# Patient Record
Sex: Female | Born: 1935 | Race: White | Hispanic: No | Smoking: Current every day smoker
Health system: Southern US, Community
[De-identification: ages and names within clinical notes are randomized; demographics above are authoritative.]

## PROBLEM LIST (undated history)

## (undated) DIAGNOSIS — Z972 Presence of dental prosthetic device (complete) (partial): Secondary | ICD-10-CM

## (undated) DIAGNOSIS — N823 Fistula of vagina to large intestine: Secondary | ICD-10-CM

## (undated) DIAGNOSIS — M1990 Unspecified osteoarthritis, unspecified site: Secondary | ICD-10-CM

## (undated) DIAGNOSIS — N281 Cyst of kidney, acquired: Secondary | ICD-10-CM

## (undated) DIAGNOSIS — E785 Hyperlipidemia, unspecified: Secondary | ICD-10-CM

## (undated) DIAGNOSIS — K08109 Complete loss of teeth, unspecified cause, unspecified class: Secondary | ICD-10-CM

## (undated) DIAGNOSIS — M810 Age-related osteoporosis without current pathological fracture: Secondary | ICD-10-CM

## (undated) DIAGNOSIS — I10 Essential (primary) hypertension: Secondary | ICD-10-CM

## (undated) DIAGNOSIS — I4891 Unspecified atrial fibrillation: Secondary | ICD-10-CM

## (undated) HISTORY — DX: Unspecified osteoarthritis, unspecified site: M19.90

## (undated) HISTORY — DX: Hyperlipidemia, unspecified: E78.5

## (undated) HISTORY — DX: Age-related osteoporosis without current pathological fracture: M81.0

## (undated) HISTORY — DX: Essential (primary) hypertension: I10

## (undated) HISTORY — PX: OTHER SURGICAL HISTORY: SHX169

---

## 1997-05-14 ENCOUNTER — Other Ambulatory Visit: Admission: RE | Admit: 1997-05-14 | Discharge: 1997-05-14 | Payer: Self-pay | Admitting: Obstetrics and Gynecology

## 1998-07-15 ENCOUNTER — Other Ambulatory Visit: Admission: RE | Admit: 1998-07-15 | Discharge: 1998-07-15 | Payer: Self-pay | Admitting: Obstetrics and Gynecology

## 1999-09-04 ENCOUNTER — Other Ambulatory Visit: Admission: RE | Admit: 1999-09-04 | Discharge: 1999-09-04 | Payer: Self-pay | Admitting: Obstetrics and Gynecology

## 2000-09-14 ENCOUNTER — Other Ambulatory Visit: Admission: RE | Admit: 2000-09-14 | Discharge: 2000-09-14 | Payer: Self-pay | Admitting: Obstetrics and Gynecology

## 2003-11-06 ENCOUNTER — Other Ambulatory Visit: Admission: RE | Admit: 2003-11-06 | Discharge: 2003-11-06 | Payer: Self-pay | Admitting: Obstetrics and Gynecology

## 2005-01-04 HISTORY — PX: CHOLECYSTECTOMY: SHX55

## 2008-01-05 HISTORY — PX: COLONOSCOPY: SHX174

## 2008-08-30 ENCOUNTER — Inpatient Hospital Stay (HOSPITAL_COMMUNITY): Admission: EM | Admit: 2008-08-30 | Discharge: 2008-09-02 | Payer: Self-pay | Admitting: Internal Medicine

## 2008-08-30 ENCOUNTER — Encounter (INDEPENDENT_AMBULATORY_CARE_PROVIDER_SITE_OTHER): Payer: Self-pay | Admitting: Internal Medicine

## 2008-08-30 ENCOUNTER — Ambulatory Visit: Payer: Self-pay | Admitting: Vascular Surgery

## 2010-02-24 ENCOUNTER — Inpatient Hospital Stay (HOSPITAL_COMMUNITY)
Admission: EM | Admit: 2010-02-24 | Discharge: 2010-02-26 | DRG: 194 | Disposition: A | Discharge status: Home / Self Care | Payer: Medicare Other | Attending: Internal Medicine | Admitting: Internal Medicine

## 2010-02-24 DIAGNOSIS — N390 Urinary tract infection, site not specified: Secondary | ICD-10-CM | POA: Diagnosis present

## 2010-02-24 DIAGNOSIS — E785 Hyperlipidemia, unspecified: Secondary | ICD-10-CM | POA: Diagnosis present

## 2010-02-24 DIAGNOSIS — K5900 Constipation, unspecified: Secondary | ICD-10-CM | POA: Diagnosis present

## 2010-02-24 DIAGNOSIS — I10 Essential (primary) hypertension: Secondary | ICD-10-CM | POA: Diagnosis present

## 2010-02-24 DIAGNOSIS — Z7982 Long term (current) use of aspirin: Secondary | ICD-10-CM

## 2010-02-24 DIAGNOSIS — J189 Pneumonia, unspecified organism: Principal | ICD-10-CM | POA: Diagnosis present

## 2010-02-25 ENCOUNTER — Emergency Department (HOSPITAL_COMMUNITY): Payer: Medicare Other

## 2010-02-25 LAB — BASIC METABOLIC PANEL
CO2: 24 mEq/L (ref 19–32)
CO2: 27 mEq/L (ref 19–32)
Calcium: 8.3 mg/dL — ABNORMAL LOW (ref 8.4–10.5)
Chloride: 99 mEq/L (ref 96–112)
Creatinine, Ser: 0.72 mg/dL (ref 0.4–1.2)
GFR calc Af Amer: >60 mL/min (ref >60)
Glucose, Bld: 119 mg/dL — ABNORMAL HIGH (ref 70–99)
Sodium: 131 mEq/L — ABNORMAL LOW (ref 135–145)
Sodium: 135 mEq/L (ref 135–145)

## 2010-02-25 LAB — DIFFERENTIAL
Basophils Absolute: 0 10*3/uL (ref 0.0–0.1)
Eosinophils Absolute: 0 10*3/uL (ref 0.0–0.7)
Eosinophils Relative: 0 % (ref 0–5)
Lymphocytes Relative: 11 % — ABNORMAL LOW (ref 12–46)
Lymphocytes Relative: 12 % (ref 12–46)
Lymphs Abs: 1.3 10*3/uL (ref 0.7–4.0)
Monocytes Absolute: 1.5 10*3/uL — ABNORMAL HIGH (ref 0.1–1.0)
Monocytes Absolute: 1.7 10*3/uL — ABNORMAL HIGH (ref 0.1–1.0)
Monocytes Relative: 16 % — ABNORMAL HIGH (ref 3–12)
Neutro Abs: 8 10*3/uL — ABNORMAL HIGH (ref 1.7–7.7)

## 2010-02-25 LAB — CBC
HCT: 36.4 % (ref 36.0–46.0)
HCT: 37.1 % (ref 36.0–46.0)
Hemoglobin: 12.2 g/dL (ref 12.0–15.0)
MCH: 30.6 pg (ref 26.0–34.0)
MCHC: 33.2 g/dL (ref 30.0–36.0)
MCHC: 33.5 g/dL (ref 30.0–36.0)
MCV: 91.2 fL (ref 78.0–100.0)
Platelets: 259 10*3/uL (ref 150–400)
RDW: 13.5 % (ref 11.5–15.5)

## 2010-02-25 LAB — URINALYSIS, ROUTINE W REFLEX MICROSCOPIC
Bilirubin Urine: NEGATIVE
Nitrite: NEGATIVE
Protein, ur: NEGATIVE mg/dL
Urobilinogen, UA: 1 mg/dL (ref 0.0–1.0)

## 2010-02-27 LAB — URINE CULTURE: Culture  Setup Time: 201202231036

## 2010-03-09 NOTE — H&P (Signed)
[name], [name]               ACCOUNT NO.:  [id number]  MEDICAL RECORD NO.:  [id number]           PATIENT TYPE:  LOCATION:                                 FACILITY:  PHYSICIAN:  Della Goo, M.D. DATE OF BIRTH:  28-Sep-1932  DATE OF ADMISSION: DATE OF DISCHARGE:                             HISTORY & PHYSICAL   DATE OF ADMISSION:  February 25, 2010.  PRIMARY CARE PHYSICIAN:  Unassigned.  CHIEF COMPLAINT:  Constipation and elevated blood pressure.  HISTORY OF PRESENT ILLNESS:  This is a 77 year old female who was brought to the emergency department by her daughter secondary to complaints of severe constipation for the past 12 days.  The patient had been seen in the emergency department at [facility] for her symptoms recently and had been found to have no blockage per her report.  The patient continued to take over-the-counter laxative therapies, but continues to have difficulty.  The patient reports having nausea and poor appetite and also having fevers and chills over the past day.  She denies having any vomiting.  The patient was found in the emergency department to have a temperature of 103.0.  She was also found to have an elevated white count of 12.7.  An acute abdominal series was performed, revealing no free air and a nonobstructive bowel gas pattern, and moderate colonic fecal matter was present.  Also, a small left pleural effusion was seen, and a right lower lobe atelectasis/infiltrate was also seen in the chest x-ray portion.  The patient was referred for medical admission secondary to her fever and diagnosis of pneumonia.  PAST MEDICAL HISTORY:  Significant for hypertension and hyperlipidemia.  PAST SURGICAL HISTORY:  History of cholecystectomy and a benign left breast cyst excision.  MEDICATIONS: 1. Simvastatin. 2. Metoprolol. 3. Oxcarbazepine. 4. Aspirin. 5. Cozaar. 6. Amlodipine. 7. Clonidine.  ALLERGIES:  No known drug  allergies.  SOCIAL HISTORY:  The patient lives with her daughter.  She is a nonsmoker, nondrinker.  No history of illicit drug usage.  FAMILY HISTORY:  Noncontributory.  REVIEW OF SYSTEMS:  Pertinents mentioned above.  PHYSICAL EXAMINATION FINDINGS:  GENERAL:  This is a 77 year old elderly, well-nourished, well-developed Caucasian female in no acute distress. VITAL SIGNS:  Temperature 101.3, blood pressure 167/73, heart rate 87, respirations 17, and O2 sat is 98%. HEENT:  Normocephalic, atraumatic.  Pupils equally round and reactive to light.  Extraocular movements are intact.  Funduscopic benign.  There is no scleral icterus.  Nares are patent bilaterally.  Oropharynx is clear. NECK:  Supple, full range of motion.  No thyromegaly, adenopathy, or jugular venous distention. CARDIOVASCULAR:  Regular rate and rhythm.  No murmurs, gallops, or rubs appreciated. LUNGS:  Clear to auscultation bilaterally.  No rales, rhonchi, or wheezes.  There is no laboring of her breathing.  Breath sounds are symmetric. ABDOMEN:  Positive bowel sounds; soft, nontender, nondistended.  No hepatosplenomegaly. EXTREMITIES:  Without cyanosis, clubbing, or edema. NEUROLOGIC:  Nonfocal.  LABORATORY STUDIES:  White blood cell count 12.7, hemoglobin 12.3, hematocrit 37.1, MCV 90.0, platelets 259, neutrophils 76%, and lymphocytes 11%.  Sodium 131, potassium 4.8, chloride 99,  carbon dioxide 24, BUN 8, creatinine 0.70, and glucose 123.  Urinalysis reveals small urine hemoglobin and small leukocyte esterase.  Urine microscopic with rare urine epithelials.  Urine red blood cells 3-6, urine white blood cells 3-6, and few bacteria.  Sodium 131, potassium 4.8, chloride 99, carbon dioxide 24, BUN 8, creatinine 0.70, glucose 123, and calcium 8.8. The acute abdominal series is as mentioned above in the HPI.  ASSESSMENT:  A 77 year old female being admitted with 1. Pneumonia which is community-acquired pneumonia. 2.  Constipation. 3. Hypertension. 4. Hyperlipidemia.  PLAN:  The patient will be admitted.  She will be placed on antibiotic therapy to cover community-acquired pneumonia.  Blood cultures x2 have been requested.  The antibiotic therapy is IV Rocephin and azithromycin at this time.  IV fluids have been ordered for gentle rehydration. Antiemetics have also been ordered as needed.  Laxative therapy has also been ordered.  Please note, in the emergency department, patient had been given magnesium citrate.  The patient's regular medications will be further reconciled, and the patient is a full code.     Della Goo, M.D.     HJ/MEDQ  D:  02/26/2010  T:  02/26/2010  Job:  [id number]  Electronically Signed by Della Goo M.D. on 03/09/2010 08:22:11 PM

## 2010-03-12 NOTE — Discharge Summary (Signed)
[name], LANPHIER               ACCOUNT NO.:  [id number]  MEDICAL RECORD NO.:  [id number]           PATIENT TYPE:  I  LOCATION:  3739                         FACILITY:  [facility]  PHYSICIAN:  Baltazar Najjar, MD     DATE OF BIRTH:  [birth year]  DATE OF ADMISSION:  02/24/2010 DATE OF DISCHARGE:  02/26/2010                              DISCHARGE SUMMARY   FINAL DISCHARGE DIAGNOSES: 1. Community-acquired pneumonia. 2. Constipation. 3. Probable urinary tract infection.  SECONDARY DISCHARGE DIAGNOSES: 1. Hypertension. 2. Hyperlipidemia.  BRIEF ADMITTING HISTORY:  Please refer to the H and P for more details. [name] is 77 year old Caucasian woman with the above medical history presented to the ER with chief complaint of constipation for more than 12 days.  She was also experiencing some fevers and chills.  Denies any vomiting or nausea.  HOSPITAL COURSE:  Workup in the ER revealed elevated white count of 12.7, temperature of 103, and her chest x-ray showed right lower lobe atelectasis versus infiltrates.  The patient was admitted with a diagnosis of community-acquired pneumonia and constipation. 1. Community-acquired pneumonia.  The patient was started on Rocephin     and Zithromax and she remained stable, afebrile and symptomatically     improved as far as her cough and chills. 2. Constipation.  The patient had an acute abdominal x-ray in the ER     that showed moderate proximal chronic fecal material with     nonobstructive bowel gas pattern.  The patient was treated     symptomatically with Senokot-S, MiraLax, and Fleet Enema.  She had     several bowel movements with relief of her constipation and     abdominal discomfort. 3. Probable urinary tract infection.  Her urinalysis on admission     showed small leukocytes and 3-6 wbc's; however, the patient is     asymptomatic.  Denies any dysuria, flank pain of increasing urinary     frequency.  I will send urine for culture and  the patient will be     discharged on Levaquin hopefully that will cover for pneumonia and     UTI and I will defer follow-up of urine culture to her PCP. 4. Hypertension.  The patient was continued on her home     antihypertensive regimen and her blood pressure remained fairly     controlled. 5. Hyperlipidemia.  The patient was continued on her Zocor during this     hospitalization.  DISCHARGE MEDICATIONS: 1. The patient will be discharged on Levaquin 750 mg p.o. daily for 6     days to complete 7 days of antibiotics. 2. MiraLax 17 grams p.o. daily as needed. 3. Senokot-S 8.6/50 mg 2 tablets p.o. daily as needed. 4. Amlodipine 5 mg p.o. daily. 5. Aspirin 81 mg p.o. daily. 6. Clonidine 0.2 mg p.o. daily. 7. Cozaar 100 mg p.o. daily. 8. Metoprolol 25 mg p.o. b.i.d. 9. Oxcarbazepine 300 mg 1 tablet p.o. daily. 10.Zocor 20 mg p.o. daily at bedtime.  DISCHARGE INSTRUCTIONS: 1. The patient to continue the above medications as prescribed. 2. The patient to follow with her PCP within 1 week.  3. Urine culture will be pending at the time of dictation.  I will     defer her follow up to her PCP and adjust antibiotic accordingly if     needed. 4. The patient encouraged to drink plenty of fluids stay on high fiber     health heart diet and take her bowel regimen as needed and also I     encouraged to ambulate, hopefully that will help with her     regulating her bowels. 5. The patient to report any worsening of symptoms or any new symptoms     to her PCP or come to the ED.  CONDITION ON DISCHARGE:  Improved.          ______________________________ Baltazar Najjar, MD     SA/MEDQ  D:  02/26/2010  T:  02/26/2010  Job:  [id number]  cc:   Feliciana Rossetti, MD  Electronically Signed by Hannah Beat MD on 03/11/2010 09:25:50 PM

## 2010-04-11 LAB — LIPID PANEL
HDL: 91 mg/dL (ref >39)
LDL Cholesterol: 51 mg/dL (ref 0–99)
Total CHOL/HDL Ratio: 1.6 RATIO
Triglycerides: 32 mg/dL (ref <150)
VLDL: 6 mg/dL (ref 0–40)

## 2010-04-11 LAB — BASIC METABOLIC PANEL
BUN: 4 mg/dL — ABNORMAL LOW (ref 6–23)
CO2: 27 mEq/L (ref 19–32)
CO2: 28 mEq/L (ref 19–32)
Calcium: 8.3 mg/dL — ABNORMAL LOW (ref 8.4–10.5)
Calcium: 8.7 mg/dL (ref 8.4–10.5)
Creatinine, Ser: 0.56 mg/dL (ref 0.4–1.2)
Creatinine, Ser: 0.64 mg/dL (ref 0.4–1.2)
GFR calc Af Amer: >60 mL/min (ref >60)
Glucose, Bld: 89 mg/dL (ref 70–99)

## 2010-04-11 LAB — CBC
HCT: 39.3 % (ref 36.0–46.0)
MCHC: 33.5 g/dL (ref 30.0–36.0)
Platelets: 269 10*3/uL (ref 150–400)
RDW: 13.9 % (ref 11.5–15.5)
WBC: 14.1 10*3/uL — ABNORMAL HIGH (ref 4.0–10.5)

## 2010-04-11 LAB — MAGNESIUM: Magnesium: 2.5 mg/dL (ref 1.5–2.5)

## 2010-04-11 LAB — URINALYSIS, ROUTINE W REFLEX MICROSCOPIC
Hgb urine dipstick: NEGATIVE
Nitrite: NEGATIVE
Protein, ur: NEGATIVE mg/dL
Specific Gravity, Urine: 1.008 (ref 1.005–1.030)
Urobilinogen, UA: 0.2 mg/dL (ref 0.0–1.0)

## 2010-04-11 LAB — COMPREHENSIVE METABOLIC PANEL
AST: 27 U/L (ref 0–37)
Albumin: 3.6 g/dL (ref 3.5–5.2)
Alkaline Phosphatase: 78 U/L (ref 39–117)
BUN: 9 mg/dL (ref 6–23)
Chloride: 99 mEq/L (ref 96–112)
Potassium: 2.9 mEq/L — ABNORMAL LOW (ref 3.5–5.1)
Total Bilirubin: 0.6 mg/dL (ref 0.3–1.2)

## 2010-04-11 LAB — PROTIME-INR: INR: 1 (ref 0.00–1.49)

## 2010-05-19 NOTE — H&P (Signed)
NAME:  [name], [name]               ACCOUNT NO.:  [id number]   MEDICAL RECORD NO.:  [id number]          PATIENT TYPE:  INP   LOCATION:  5531                         FACILITY:  [facility]   PHYSICIAN:  Jude Ojie, MD          DATE OF BIRTH:  1932-11-02   DATE OF ADMISSION:  08/30/2008  DATE OF DISCHARGE:                              HISTORY & PHYSICAL   PRIMARY CARE PHYSICIAN:  The patient is unassigned.   CHIEF COMPLAINT:  Abdominal pain, constipation, nausea and vomiting x5  days.  The patient was transferred from [facility] on account of  ileus for further evaluation.   HISTORY OF PRESENTING COMPLAINT:  The patient is a 77 year old with  history of hypertension, hyperlipidemia who was transferred from  [facility] today to [facility] on account of the ileus.  Of note,  the patient had been started on Caltrate about 1 week ago for treatment  of osteoporosis and had not been drinking enough fluid with the  Caltrate.  Subsequently developed constipation, abdominal distention,  nausea and vomiting for the last 5 days as such presented to the  hospital at [facility].  Was evaluated over there with initial workup done  unremarkable.  Of note, the patient had a CT of the abdomen done at  [facility] which was negative for any obstruction.  The patient was  subsequently transferred here for further evaluation and management.  The patient was given enemas and GoLYTELY over there with no significant  bowel movement but had worsening of the abdominal distention with  discomfort.  At the time I saw the patient, the patient was still having  some distention with mild abdominal pains.  Denies any associated chest  pain or shortness of breath.   Past medical history is significant for:  1. Hypertension.  2. Hyperlipidemia.   ALLERGIES:  The patient has no known drug allergies.   OUTPATIENT MEDICATIONS:  1. Ambien 10 mg p.o. nightly as needed.  2. Aspirin 81 mg p.o. daily.  3. Caltrate  600 plus D 1 tablet p.o. daily.  4. Simvastatin 20 mg p.o. nightly.  5. Amlodipine 5 mg p.o. daily.   FAMILY HISTORY:  The patient lives alone but has good family support  with 2 adult daughters who live close by and they check on her  routinely.   FAMILY HISTORY:  Significant for diabetes in the past.  The patient  smokes half a pack of cigarettes per day, started smoking about 28 years  ago when she lost her husband.  Denies any history of alcohol or IV drug  use.   REVIEW OF SYSTEMS:  GENERAL:  No fevers, weight loss, or loss of  appetite.  RESPIRATORY:  No shortness of breath or cough productive of  sputum.  CVS:  No chest pain or palpitation.  GI:  Positive for nausea,  vomiting, abdominal distention, and constipation.  ENDOCRINE:  No  polyuria, polydipsia, polyphagia.  GENITOURINARY:  No dysuria,  frequency, or urgency.  HEME:  No easy skin bruising or epistaxis.  PSYCH:  No depression.  MUSCULOSKELETAL:  No  joint pains or muscle ache.  Does have chronic low back pain.  NEURO:  The patient denies any  headache, seizures, or syncope.   PHYSICAL EXAMINATION:  VITAL SIGNS:  On presentation, blood pressure of  150/72, pulse rate of 87, respiratory rate of 20, O2 sat of 94%.  The  patient is afebrile.  GENERAL:  Elderly Caucasian lady in mild distress from abdominal  discomfort.  HEENT:  Pupils equal, round, reacting to light and accommodation  bilaterally.  Extraocular muscles intact.  NECK:  Supple.  No JVD.  No thyroid enlargement.  RESPIRATORY:  Chest clear to auscultation bilaterally.  CVS:  Heart sounds S1 and S2.  Regular rate and rhythm.  No murmurs,  rub, or gallop.  ABDOMEN:  Full, mildly distended with mild gaseous tenderness but no  guarding or rebound.  Bowel sounds are mildly positive (hypoactive) in  all quadrants.  RECTAL:  Also the patient had a rectal exam already done prior to  transfer from [place], which was guaiac negative.  EXTREMITIES:  No cyanosis,  clubbing, or edema.  Pedal pulses palpable  bilaterally.  NEURO:  The patient is awake, alert and oriented to time, place, and  person.  No focal findings at this time.    Labs done from [facility] were essentially unremarkable per the  transferring physician.  CT scan of the abdomen and pelvis was done.  The imaging is on a CD in the chart.  Report of the CT abdomen stated  there was moderate colonic distention with fluid, the probable post  enema and colonic ileus.  No bowel or colonic obstruction noted.  No  hydronephrosis or hydroureter.  No ascites of free air.  Pelvis showed  moderate distention of the cecum with fluid without pericecal  inflammation.  Stool noted also within the sigmoid colon.  Plan is to  get a basic labs with CBC, BMP, LFTs at this time, as I cannot find the  official report from labs done at [place] at this time.   ASSESSMENT:  1. Ileus with abdominal distention, nausea, vomiting, and abdominal      pains.  This is likely secondary to Caltrate, which was started      recently.  The patient had not been taking enough fluids with the      Caltrate in the last 5 days.  2. History of hypertension and hyperlipidemia.   PLAN:  1. We will admit to inpatient.  2. We will place the patient on bowel rest at this time, keep n.p.o.,      and place on gentle hydration with IV fluid normal saline at 75 mL      per hour.  We will also monitor electrolytes to rule out any      associated electrolyte abnormalities such as hypokalemia, as this      could worsen the ongoing ileus.  3. We will also avoid any form of narcotics at this time, as this also      could worsen the ongoing ileus.  4. We will continue conservative management and one of the [facility] will reevaluate the patient in the morning.  If no      significant improvement, we would recommend getting GI consultation      for evaluation and management at that time.  5. We will continue her  home dose of simvastatin, but we will hold off      on the outpatient Caltrate at this time.  Education given to the      patient and the family members on the importance of adequate      hydration whenever the Caltrate is restarted upon discharge.   Case and plan discussed extensively with the patient and the family at  the bedside, they voiced understanding and they are in agreement with  the plan of care.      Jude Enid Baas, MD  Electronically Signed     JO/MEDQ  D:  08/30/2008  T:  08/30/2008  Job:  [id number]

## 2010-05-19 NOTE — Discharge Summary (Signed)
NAMEWILLY, [name]               ACCOUNT NO.:  [id number]   MEDICAL RECORD NO.:  [id number]          PATIENT TYPE:  INP   LOCATION:  5531                         FACILITY:  [facility]   PHYSICIAN:  Isidor Holts, M.D.  DATE OF BIRTH:  Jul 18, 1932   DATE OF ADMISSION:  08/30/2008  DATE OF DISCHARGE:  09/02/2008                               DISCHARGE SUMMARY   PRIMARY MEDICAL DOCTOR:  Dr. Reece Agar, [place].   PRIMARY ORTHOPEDIC SURGEON:  Georges Lynch. Gioffre, M.D.   DISCHARGE DIAGNOSES:  1. Colonic ileus.  2. Hypertension.  3. Dyslipidemia.  4. Osteoporosis.  5. History of left forefoot fracture in May 2010.   DISCHARGE MEDICATIONS:  1. Ambien 10 mg p.o. p.r.n. q.h.s.  2. Enteric-coated aspirin 81 mg p.m. daily.  3. Zocor 20 mg p.o. q.h.s.  4. Lisinopril 10 mg p.o. daily.  5. MiraLax 17 grams p.o. p.r.n. daily for constipation.   Note:  Caltrate plus vitamin D, and Norvasc have been discontinued.   PROCEDURES:  1. Abdominal x-ray done August 30, 2008.  This showed diffuse gaseous      distention of the large and small bowel.  Imaging features      compatible with report of clinical history of ileus.  2. Abdominal x-ray done August 31, 2008.  This showed improving ileus.  3..  Abdominal x-ray done September 01, 2008.  This showed bowel gas  pattern within normal limits.  No residual dilated bowel to suggest  ileus.  Question left hemidiaphragm elevation versus subpulmonic left  pleural effusion and possible left basilar atelectasis.   CONSULTATIONS:  None.   ADMISSION HISTORY:  As in [name] notes of August 30, 2008, dictated by Dr.  Jama Glover.  However, in brief, this is a 77 year old female, with known  history of hypertension, dyslipidemia, osteoporosis, status post left  forefoot osteoporotic fracture Jun 02, 2008, presenting via transfer  from [facility] to [facility], with a diagnosis of  colonic ileus.  Reportedly, she had been started on  Caltrate with  vitamin D approximately 1 week prior, for treatment of osteoporosis and  also approximately about that time her Norvasc was increased from 5 mg  p.o. daily to 10 mg p.o. daily. Inaddition, he had not been maintaining  adequate oral fluid intake.  She was given enemas and GoLYTELY with no  significant improvement, and was subsequently transferred to [facility] and admitted to the medical service.   CLINICAL COURSE:  1. Colonic ileus.  For details of presentation, refer to admission      history above.  This appears to have been multifactorial, secondary      to Caltrate and Norvasc, against a background of inadequate oral      fluid intake, as well as opioid medication and immobilization      occasioned by her fracture in May 2010.  She was also found to be      significantly hypokalemic, with a potassium of 2.9, secondary to      nausea and vomiting over the 5 days prior to admission.  She was  kept n.p.o. given intravenous fluid hydration, and electrolytes      were appropriately repleted.  Caltrate and Norvasc were of course,      placed on hold and intravenous Reglan commenced.  Clinical      improvement was satisfactory.  Serial abdominal x-rays demonstrated      progressive improvement of ileus which had completely resolved      clinically and radiologically by September 01, 2008.  From August 31, 2008, we were able to commence the patient on oral fluids, and by      September 01, 2008, she tolerated a regular diet and had normal bowel      movements, albeit scanty.  By September 02, 2008, she was totally      asymptomatic.  Caltrate and Norvasc have been put on hold, until      reevaluated by the patient's primary M.D.   1. Hypertension.  The patient has a known history of hypertension.      Because of #1 above, Norvasc has been discontinued.  She was      managed with Lisinopril during the course of this hospitalization.   1. Dyslipidemia.  The patient  continues on pre-admission statin.  Her      lipid profile during this hospitalization, showed the following      findings:  Total cholesterol 148, triglyceride 32, HDL 91, LDL 51;      i.e. excellent lipid profile.  She has been reassured accordingly.   1. Osteoporosis/osteoporotic left forefoot fracture, Jun 02, 2008.      The patient reportedly was seen by her orthopedic surgeon, Dr.      Darrelyn Glover, on August 26, 2008.  He at that time, recommended      ambulation.  She was evaluated by PT/OT during the course of this      hospitalization, and judged not to have any skilled PT needs.  She      was able to ambulate without difficulty during this hospitalization      and has been encouraged to continue.  Incidentally, bilateral lower      extremity venous Dopplers done to rule out possible occult DVT,      were negative bilaterally.  The patient will follow up with Dr.      Darrelyn Glover on discharge.   DISPOSITION:  The patient was on September 02, 2008 asymptomatic,  considered clinically stable.  It appears the patient does have a  tendency to constipation.  MiraLax has been added to her discharge  medications, for p.r.n. use.  The patient was discharged on August 13, 2008.   DIET:  Heart-healthy.   ACTIVITY:  As tolerated.  Recommended to increase activity slowly.   FOLLOW-UP INSTRUCTIONS:  1. The patient is to follow up with her primary M.D., Dr. Shary Glover,      [place], [facility], within 1-2 weeks of      discharge.  She has been instructed to call for an appointment.  2. In addition, she is to follow up with Dr. Darrelyn Glover, orthopedic      surgeon routinely, per scheduled appointment.  All this has been      communicated to the patient and family.  They have verbalized      understanding.      Isidor Holts, M.D.  Electronically Signed     CO/MEDQ  D:  09/02/2008  T:  09/02/2008  Job:  [id number]   cc:   Feliciana Rossetti, MD  Ronald A. [name], M.D.

## 2013-01-04 HISTORY — PX: CATARACT EXTRACTION W/ INTRAOCULAR LENS  IMPLANT, BILATERAL: SHX1307

## 2013-07-02 ENCOUNTER — Telehealth (INDEPENDENT_AMBULATORY_CARE_PROVIDER_SITE_OTHER): Payer: Self-pay | Admitting: *Deleted

## 2013-07-02 NOTE — Telephone Encounter (Signed)
Received a call from Dr. Ewell PoeAnderson's office this morning.  They advised that the pt has feces coming from her vagina x 3 days.   They wanted pt to see one of our surgeons this week.  They advised that pt saw a GI Dr 3 weeks ago b/c she was not having any bowel movements.  GI put her on medications to help and advised pt that she had a small kink in bowel and diverticulum on left side which was minor.  Pt is having some abdominal discomfort.  No fever/no chills.  I got some advice from [facility] Smithey, LPN in office and she advised if pt wasn't having any chills or fevers, that we could see her in office if anyone had an opening.  I went to Mil[place] HockBernie Morris, Dr. Maris Bergerosenbower's assistant, she gave me an appt for pt 07-04-13.  Dr. Ewell PoeAnderson's office was agreeable with this appt and they will fax what notes they have on the pt and have pt's daughter have the MRI faxed to us before the appt.   Victorino DikeJennifer

## 2013-07-03 ENCOUNTER — Encounter (INDEPENDENT_AMBULATORY_CARE_PROVIDER_SITE_OTHER): Payer: Self-pay

## 2013-07-04 ENCOUNTER — Ambulatory Visit (INDEPENDENT_AMBULATORY_CARE_PROVIDER_SITE_OTHER): Payer: Medicare Other | Admitting: General Surgery

## 2013-07-04 ENCOUNTER — Other Ambulatory Visit (INDEPENDENT_AMBULATORY_CARE_PROVIDER_SITE_OTHER): Payer: Self-pay | Admitting: General Surgery

## 2013-07-04 ENCOUNTER — Encounter (INDEPENDENT_AMBULATORY_CARE_PROVIDER_SITE_OTHER): Payer: Self-pay | Admitting: General Surgery

## 2013-07-04 ENCOUNTER — Telehealth (INDEPENDENT_AMBULATORY_CARE_PROVIDER_SITE_OTHER): Payer: Self-pay

## 2013-07-04 VITALS — BP 124/80 | HR 72 | Temp 97.4°F | Resp 16 | Ht 60.0 in | Wt 114.0 lb

## 2013-07-04 DIAGNOSIS — N823 Fistula of vagina to large intestine: Secondary | ICD-10-CM

## 2013-07-04 DIAGNOSIS — N824 Other female intestinal-genital tract fistulae: Secondary | ICD-10-CM

## 2013-07-04 NOTE — Progress Notes (Signed)
Patient ID: [name], female   DOB: 08/02/1932, 80 y.o.   MRN: [id number]  Chief Complaint  Patient presents with  . Abdominal Pain    HPI [name] is a 80 y.o. female.   Abdominal Pain Associated symptoms: constipation and vaginal discharge     She is referred by Dr. Malva LimesMark Anderson for further evaluation and treatment of a possible rectovaginal fistula. He wanted to refer her to Dr. Maisie Fushomas, however Dr. Maisie Fushomas is out of town this week. Her daughter is here with her. In April, she developed significant lower abdominal pain was hospitalized with a severe fecal impaction. She underwent some type of radiologic study at that time which sound like a barium enema. Eventually the impaction resolved. She's been on daily MiraLAX since that time. Approximately 2 weeks ago, she started noticing stool passing out her vagina. She spoke with Dr. Ewell PoeAnderson's office this week and he referred her for evaluation. Her bowels are mostly loose. She's not had a problem like this before. No fecaluria.  No fevers or chills. No blood per vagina or blood per rectum. She had 5 children via vaginal delivery. No fecal incontinence. Last colonoscopy was 5 years ago.  Past Medical History  Diagnosis Date  . Arthritis   . Hyperlipidemia   . Hypertension   . Osteoporosis     Past Surgical History  Procedure Laterality Date  . Gallbladder surgery    . Eye surgery      History reviewed. No pertinent family history.  Social History History  Substance Use Topics  . Smoking status: Current Every Day Smoker    Types: Cigarettes  . Smokeless tobacco: Not on file  . Alcohol Use: No    Allergies  Allergen Reactions  . Hydrocodone     Current Outpatient Prescriptions  Medication Sig Dispense Refill  . amLODipine (NORVASC) 10 MG tablet Take 10 mg by mouth daily.      Marland Kitchen. atorvastatin (LIPITOR) 10 MG tablet Take 10 mg by mouth daily.      . carvedilol (COREG) 6.25 MG tablet Take 6.25 mg by mouth 2 (two)  times daily with a meal.      . cloNIDine (CATAPRES) 0.2 MG tablet Take 0.2 mg by mouth 2 (two) times daily.      Marland Kitchen. gabapentin (NEURONTIN) 300 MG capsule Take 300 mg by mouth 3 (three) times daily.      Marland Kitchen. HYDROmorphone (DILAUDID) 2 MG tablet Take by mouth every 4 (four) hours as needed for severe pain.      Marland Kitchen. labetalol (NORMODYNE) 100 MG tablet Take 100 mg by mouth 2 (two) times daily.      Marland Kitchen. losartan (COZAAR) 100 MG tablet Take 100 mg by mouth daily.      . Polyethylene Glycol 3350 POWD 17 g by Does not apply route.      Marland Kitchen. spironolactone (ALDACTONE) 25 MG tablet Take 25 mg by mouth daily.      . traMADol (ULTRAM) 50 MG tablet Take by mouth every 6 (six) hours as needed.      . zolpidem (AMBIEN) 10 MG tablet Take 10 mg by mouth at bedtime as needed for sleep.       No current facility-administered medications for this visit.    Review of Systems Review of Systems  Constitutional: Negative.   HENT: Negative.   Respiratory: Negative.   Cardiovascular: Negative.   Gastrointestinal: Positive for abdominal pain and constipation.  Genitourinary: Positive for vaginal discharge.  Neurological: Negative.  Hematological: Bruises/bleeds easily.    Blood pressure 124/80, pulse 72, temperature 97.4 F (36.3 C), resp. rate 16, height 5' (1.524 m), weight 114 lb (51.71 kg).  Physical Exam Physical Exam  Constitutional: No distress.  End, elderly female  HENT:  Head: Normocephalic and atraumatic.  Abdominal: Soft. She exhibits distension. She exhibits no mass. There is no tenderness.  Genitourinary: Vaginal discharge (Feculent) found.  No anal fissures. No palpable and no masses on digital rectal exam. No blood in the rectal vault. Good sphincter tone. Anoscopy demonstrated no obvious irregularities in the mucosa.  Vaginal exam demonstrated stool on the examining finger. No palpable irregularity. Vaginoscopy demonstrated no obvious posterior wall abnormality.  Neurological: She is alert.   Skin: Skin is warm and dry.  Psychiatric: She has a normal mood and affect. Her behavior is normal.    Data Reviewed CT and MRI reports.  Assessment    Rectovaginal fistula by history. Need to rule out a colovaginal fistula. No history of diverticulitis in the past.   Plan    CT scan of abdomen and pelvis with oral and rectal contrast. Follow up appointment with Dr. Maisie Fushomas after CT for further evaluation.       Gilberto Stanforth J 07/04/2013, 12:28 PM

## 2013-07-04 NOTE — Patient Instructions (Signed)
Take Miralax every other day instead of every day to try and make your stools a little more firm.  We will arrange for you to have a CT scan.

## 2013-07-04 NOTE — Telephone Encounter (Signed)
Pt's daughter made aware of CT scan scheduled for 07/09/13 8:30 a[facility]m [facility].  Instructions for drinking contrast were given.  Pt will also need to have labs drawn.  Information given to go to [facility].

## 2013-07-05 NOTE — Telephone Encounter (Signed)
Close encounter 

## 2013-07-09 ENCOUNTER — Ambulatory Visit
Admission: RE | Admit: 2013-07-09 | Discharge: 2013-07-09 | Disposition: A | Discharge status: Home / Self Care | Payer: Medicare Other | Source: Ambulatory Visit | Attending: General Surgery | Admitting: General Surgery

## 2013-07-09 DIAGNOSIS — N823 Fistula of vagina to large intestine: Secondary | ICD-10-CM

## 2013-07-09 MED ORDER — IOHEXOL 300 MG/ML  SOLN
100.0000 mL | Freq: Once | INTRAMUSCULAR | Status: AC | PRN
  Administered 2013-07-09: 100 mL via INTRAVENOUS

## 2013-07-10 ENCOUNTER — Ambulatory Visit (INDEPENDENT_AMBULATORY_CARE_PROVIDER_SITE_OTHER): Payer: Medicare Other | Admitting: General Surgery

## 2013-07-10 ENCOUNTER — Encounter (INDEPENDENT_AMBULATORY_CARE_PROVIDER_SITE_OTHER): Payer: Self-pay | Admitting: General Surgery

## 2013-07-10 VITALS — BP 126/80 | HR 67 | Temp 97.0°F | Ht 61.0 in | Wt 116.0 lb

## 2013-07-10 DIAGNOSIS — N824 Other female intestinal-genital tract fistulae: Secondary | ICD-10-CM

## 2013-07-10 DIAGNOSIS — N823 Fistula of vagina to large intestine: Secondary | ICD-10-CM

## 2013-07-10 NOTE — Patient Instructions (Signed)

## 2013-07-10 NOTE — Progress Notes (Signed)
Chief Complaint   Patient presents with   .  Abdominal Pain   HPI  [name] is a 80 y.o. female.  Abdominal Pain Associated symptoms: constipation and vaginal discharge  She is referred by Dr. Malva LimesMark Anderson for further evaluation and treatment of a possible rectovaginal fistula. She saw my partner Dr Abbey Chattersosenbower last week for  Her daughter is here with her. In April, she developed significant lower abdominal pain was hospitalized with a severe fecal impaction. She underwent some type of radiologic study at that time which sound like a barium enema. Eventually the impaction resolved. She's been on daily MiraLAX since that time. Approximately 2 weeks ago, she started noticing stool passing out her vagina. She spoke with Dr. Ewell PoeAnderson's office this week and he referred her for evaluation. Her bowels are mostly loose. She's not had a problem like this before. No fecaluria. No fevers or chills. No blood per vagina or blood per rectum. She had 5 children via vaginal delivery. No fecal incontinence. Last colonoscopy was 5 years ago.  Past Medical History   Diagnosis  Date   .  Arthritis    .  Hyperlipidemia    .  Hypertension    .  Osteoporosis     Past Surgical History   Procedure  Laterality  Date   .  Gallbladder surgery     .  Eye surgery     History reviewed. No pertinent family history.  Social History  History   Substance Use Topics   .  Smoking status:  Current Every Day Smoker     Types:  Cigarettes   .  Smokeless tobacco:  Not on file   .  Alcohol Use:  No    Allergies   Allergen  Reactions   .  Hydrocodone     Current Outpatient Prescriptions   Medication  Sig  Dispense  Refill   .  amLODipine (NORVASC) 10 MG tablet  Take 10 mg by mouth daily.     Marland Kitchen.  atorvastatin (LIPITOR) 10 MG tablet  Take 10 mg by mouth daily.     .  carvedilol (COREG) 6.25 MG tablet  Take 6.25 mg by mouth 2 (two) times daily with a meal.     .  cloNIDine (CATAPRES) 0.2 MG tablet  Take 0.2 mg by mouth  2 (two) times daily.     Marland Kitchen.  gabapentin (NEURONTIN) 300 MG capsule  Take 300 mg by mouth 3 (three) times daily.     Marland Kitchen.  HYDROmorphone (DILAUDID) 2 MG tablet  Take by mouth every 4 (four) hours as needed for severe pain.     Marland Kitchen.  labetalol (NORMODYNE) 100 MG tablet  Take 100 mg by mouth 2 (two) times daily.     Marland Kitchen.  losartan (COZAAR) 100 MG tablet  Take 100 mg by mouth daily.     .  Polyethylene Glycol 3350 POWD  17 g by Does not apply route.     Marland Kitchen.  spironolactone (ALDACTONE) 25 MG tablet  Take 25 mg by mouth daily.     .  traMADol (ULTRAM) 50 MG tablet  Take by mouth every 6 (six) hours as needed.     .  zolpidem (AMBIEN) 10 MG tablet  Take 10 mg by mouth at bedtime as needed for sleep.      No current facility-administered medications for this visit.   Review of Systems  Review of Systems  Constitutional: Negative.  HENT: Negative.  Respiratory: Negative.  Cardiovascular: Negative.  Gastrointestinal: Positive for abdominal pain and constipation.  Genitourinary: Positive for vaginal discharge.  Neurological: Negative.  Hematological: Bruises/bleeds easily.  Filed Vitals:   07/10/13 0950  BP: 126/80  Pulse: 67  Temp: 97 F (36.1 C)    Physical Exam  Physical Exam  Constitutional: No distress.  End, elderly female  HENT:  Head: Normocephalic and atraumatic.  Abdominal: Soft. She exhibits mild distension. She exhibits no mass. There is no tenderness.  Genitourinary: Vaginal discharge (Feculent) found.  No anal fissures. No palpable and no masses on digital rectal exam. No blood in the rectal vault. Good sphincter tone.  Vaginal exam demonstrated stool on the examining finger. Neurological: She is alert.  Skin: Skin is warm and dry.  Psychiatric: She has a normal mood and affect. Her behavior is normal.  Data Reviewed  CT and MRI reports. CT confirms low rectovaginal fistula  Assessment: [name] is a 80 y.o. F with a rectovaginal fistula, which has been confirmed by CT.   This was most likely due to her episode of severe constipation. Plan  To OR for repair with mucosal advancement flap. She understands that there is a chance for recurrence and the need for more invasive repair in the future.

## 2013-07-18 ENCOUNTER — Ambulatory Visit (INDEPENDENT_AMBULATORY_CARE_PROVIDER_SITE_OTHER): Payer: Medicare Other | Admitting: General Surgery

## 2013-07-18 ENCOUNTER — Encounter (HOSPITAL_BASED_OUTPATIENT_CLINIC_OR_DEPARTMENT_OTHER): Payer: Self-pay | Admitting: *Deleted

## 2013-07-18 NOTE — Progress Notes (Signed)
NPO AFTER MN. ARRIVE AT 0930. NEEDS ISTAT AND EKG. WILL TAKE COREG AND LABETOLOL AM DOS W/ SIPS OF WATER.

## 2013-07-20 ENCOUNTER — Encounter (HOSPITAL_BASED_OUTPATIENT_CLINIC_OR_DEPARTMENT_OTHER): Payer: Self-pay | Admitting: *Deleted

## 2013-07-20 ENCOUNTER — Encounter (HOSPITAL_BASED_OUTPATIENT_CLINIC_OR_DEPARTMENT_OTHER)
Admission: RE | Disposition: A | Discharge status: Home / Self Care | Payer: Self-pay | Source: Ambulatory Visit | Attending: General Surgery

## 2013-07-20 ENCOUNTER — Encounter (HOSPITAL_BASED_OUTPATIENT_CLINIC_OR_DEPARTMENT_OTHER): Payer: Medicare Other | Admitting: Anesthesiology

## 2013-07-20 ENCOUNTER — Other Ambulatory Visit: Payer: Self-pay

## 2013-07-20 ENCOUNTER — Ambulatory Visit (HOSPITAL_BASED_OUTPATIENT_CLINIC_OR_DEPARTMENT_OTHER): Payer: Medicare Other | Admitting: Anesthesiology

## 2013-07-20 ENCOUNTER — Ambulatory Visit (HOSPITAL_BASED_OUTPATIENT_CLINIC_OR_DEPARTMENT_OTHER)
Admission: RE | Admit: 2013-07-20 | Discharge: 2013-07-20 | Disposition: A | Discharge status: Home / Self Care | Payer: Medicare Other | Source: Ambulatory Visit | Attending: General Surgery | Admitting: General Surgery

## 2013-07-20 DIAGNOSIS — Z79899 Other long term (current) drug therapy: Secondary | ICD-10-CM | POA: Insufficient documentation

## 2013-07-20 DIAGNOSIS — M810 Age-related osteoporosis without current pathological fracture: Secondary | ICD-10-CM | POA: Diagnosis not present

## 2013-07-20 DIAGNOSIS — I498 Other specified cardiac arrhythmias: Secondary | ICD-10-CM | POA: Insufficient documentation

## 2013-07-20 DIAGNOSIS — F17200 Nicotine dependence, unspecified, uncomplicated: Secondary | ICD-10-CM | POA: Insufficient documentation

## 2013-07-20 DIAGNOSIS — K219 Gastro-esophageal reflux disease without esophagitis: Secondary | ICD-10-CM | POA: Insufficient documentation

## 2013-07-20 DIAGNOSIS — M129 Arthropathy, unspecified: Secondary | ICD-10-CM | POA: Diagnosis not present

## 2013-07-20 DIAGNOSIS — I10 Essential (primary) hypertension: Secondary | ICD-10-CM | POA: Diagnosis not present

## 2013-07-20 DIAGNOSIS — E785 Hyperlipidemia, unspecified: Secondary | ICD-10-CM | POA: Diagnosis not present

## 2013-07-20 DIAGNOSIS — N824 Other female intestinal-genital tract fistulae: Secondary | ICD-10-CM | POA: Diagnosis present

## 2013-07-20 DIAGNOSIS — K5900 Constipation, unspecified: Secondary | ICD-10-CM | POA: Diagnosis not present

## 2013-07-20 HISTORY — DX: Fistula of vagina to large intestine: N82.3

## 2013-07-20 HISTORY — DX: Presence of dental prosthetic device (complete) (partial): Z97.2

## 2013-07-20 HISTORY — DX: Cyst of kidney, acquired: N28.1

## 2013-07-20 HISTORY — DX: Complete loss of teeth, unspecified cause, unspecified class: K08.109

## 2013-07-20 LAB — POCT I-STAT, CHEM 8
BUN: 8 mg/dL (ref 6–23)
CALCIUM ION: 1.3 mmol/L (ref 1.13–1.30)
CREATININE: 0.7 mg/dL (ref 0.50–1.10)
Chloride: 102 mEq/L (ref 96–112)
Glucose, Bld: 108 mg/dL — ABNORMAL HIGH (ref 70–99)
HCT: 41 % (ref 36.0–46.0)
Hemoglobin: 13.9 g/dL (ref 12.0–15.0)
Potassium: 4.2 mEq/L (ref 3.7–5.3)
Sodium: 141 mEq/L (ref 137–147)
TCO2: 25 mmol/L (ref 0–100)

## 2013-07-20 SURGERY — CREATION, FLAP, MUCOSAL ADVANCEMENT
Anesthesia: General | Site: Rectum

## 2013-07-20 MED ORDER — SODIUM CHLORIDE 0.9 % IJ SOLN
3.0000 mL | Freq: Two times a day (BID) | INTRAMUSCULAR | Status: DC
  Filled 2013-07-20: qty 3

## 2013-07-20 MED ORDER — LACTATED RINGERS IV SOLN
INTRAVENOUS | Status: DC | PRN
  Administered 2013-07-20 (×2): via INTRAVENOUS

## 2013-07-20 MED ORDER — PROPOFOL 10 MG/ML IV BOLUS
INTRAVENOUS | Status: DC | PRN
  Administered 2013-07-20: 100 mg via INTRAVENOUS

## 2013-07-20 MED ORDER — HYDROCODONE-ACETAMINOPHEN 5-325 MG PO TABS: 1.0000 | ORAL_TABLET | Freq: Four times a day (QID) | ORAL | Status: DC | PRN

## 2013-07-20 MED ORDER — ACETAMINOPHEN 325 MG PO TABS
650.0000 mg | ORAL_TABLET | ORAL | Status: DC | PRN
  Filled 2013-07-20: qty 2

## 2013-07-20 MED ORDER — SODIUM CHLORIDE 0.9 % IJ SOLN
3.0000 mL | INTRAMUSCULAR | Status: DC | PRN
  Filled 2013-07-20: qty 3

## 2013-07-20 MED ORDER — METRONIDAZOLE 500 MG PO TABS: 500.0000 mg | ORAL_TABLET | Freq: Three times a day (TID) | ORAL | Status: DC

## 2013-07-20 MED ORDER — BUPIVACAINE-EPINEPHRINE 0.25% -1:200000 IJ SOLN
INTRAMUSCULAR | Status: DC | PRN
  Administered 2013-07-20: 10 mL

## 2013-07-20 MED ORDER — FENTANYL CITRATE 0.05 MG/ML IJ SOLN
25.0000 ug | INTRAMUSCULAR | Status: DC | PRN
  Administered 2013-07-20: 25 ug via INTRAVENOUS
  Filled 2013-07-20: qty 1

## 2013-07-20 MED ORDER — MEPERIDINE HCL 25 MG/ML IJ SOLN
6.2500 mg | INTRAMUSCULAR | Status: DC | PRN
  Filled 2013-07-20: qty 1

## 2013-07-20 MED ORDER — FENTANYL CITRATE 0.05 MG/ML IJ SOLN
INTRAMUSCULAR | Status: AC
  Filled 2013-07-20: qty 2

## 2013-07-20 MED ORDER — ROCURONIUM BROMIDE 100 MG/10ML IV SOLN
INTRAVENOUS | Status: DC | PRN
  Administered 2013-07-20: 25 mg via INTRAVENOUS

## 2013-07-20 MED ORDER — GLYCOPYRROLATE 0.2 MG/ML IJ SOLN
INTRAMUSCULAR | Status: DC | PRN
  Administered 2013-07-20: 0.4 mg via INTRAVENOUS

## 2013-07-20 MED ORDER — SODIUM CHLORIDE 0.9 % IV SOLN
250.0000 mL | INTRAVENOUS | Status: DC | PRN
  Filled 2013-07-20: qty 250

## 2013-07-20 MED ORDER — MIDAZOLAM HCL 2 MG/2ML IJ SOLN
INTRAMUSCULAR | Status: AC
  Filled 2013-07-20: qty 2

## 2013-07-20 MED ORDER — DEXAMETHASONE SODIUM PHOSPHATE 4 MG/ML IJ SOLN
INTRAMUSCULAR | Status: DC | PRN
  Administered 2013-07-20: 10 mg via INTRAVENOUS

## 2013-07-20 MED ORDER — ONDANSETRON HCL 4 MG/2ML IJ SOLN
INTRAMUSCULAR | Status: DC | PRN
  Administered 2013-07-20: 4 mg via INTRAVENOUS

## 2013-07-20 MED ORDER — KETOROLAC TROMETHAMINE 30 MG/ML IJ SOLN
INTRAMUSCULAR | Status: DC | PRN
  Administered 2013-07-20: 30 mg via INTRAVENOUS

## 2013-07-20 MED ORDER — NEOSTIGMINE METHYLSULFATE 10 MG/10ML IV SOLN
INTRAVENOUS | Status: DC | PRN
  Administered 2013-07-20: 2.5 mg via INTRAVENOUS

## 2013-07-20 MED ORDER — 0.9 % SODIUM CHLORIDE (POUR BTL) OPTIME
TOPICAL | Status: DC | PRN
  Administered 2013-07-20: 250 mL

## 2013-07-20 MED ORDER — LACTATED RINGERS IV SOLN
INTRAVENOUS | Status: DC
  Administered 2013-07-20: 11:00:00 via INTRAVENOUS
  Filled 2013-07-20: qty 1000

## 2013-07-20 MED ORDER — FENTANYL CITRATE 0.05 MG/ML IJ SOLN
INTRAMUSCULAR | Status: AC
  Filled 2013-07-20: qty 4

## 2013-07-20 MED ORDER — ACETAMINOPHEN 650 MG RE SUPP
650.0000 mg | RECTAL | Status: DC | PRN
  Filled 2013-07-20: qty 1

## 2013-07-20 MED ORDER — SODIUM CHLORIDE 0.9 % IV SOLN
1.0000 g | INTRAVENOUS | Status: AC
  Administered 2013-07-20: 1 g via INTRAVENOUS
  Filled 2013-07-20: qty 1

## 2013-07-20 MED ORDER — LIDOCAINE 5 % EX OINT
TOPICAL_OINTMENT | CUTANEOUS | Status: DC | PRN
  Administered 2013-07-20: 1

## 2013-07-20 MED ORDER — ACETAMINOPHEN 10 MG/ML IV SOLN
INTRAVENOUS | Status: DC | PRN
  Administered 2013-07-20: 1000 mg via INTRAVENOUS

## 2013-07-20 MED ORDER — CIPROFLOXACIN HCL 500 MG PO TABS: 500.0000 mg | ORAL_TABLET | Freq: Two times a day (BID) | ORAL | Status: DC

## 2013-07-20 MED ORDER — PROMETHAZINE HCL 25 MG/ML IJ SOLN
6.2500 mg | INTRAMUSCULAR | Status: DC | PRN
  Filled 2013-07-20: qty 1

## 2013-07-20 MED ORDER — FENTANYL CITRATE 0.05 MG/ML IJ SOLN
INTRAMUSCULAR | Status: DC | PRN
  Administered 2013-07-20 (×4): 50 ug via INTRAVENOUS

## 2013-07-20 MED ORDER — LIDOCAINE HCL (CARDIAC) 20 MG/ML IV SOLN
INTRAVENOUS | Status: DC | PRN
  Administered 2013-07-20: 40 mg via INTRAVENOUS

## 2013-07-20 SURGICAL SUPPLY — 55 items
BLADE SURG 15 STRL LF DISP TIS (BLADE) ×3 IMPLANT
BLADE SURG 15 STRL SS (BLADE) ×8
BRIEF STRETCH FOR OB PAD LRG (UNDERPADS AND DIAPERS) ×4 IMPLANT
CANISTER SUCTION 2500CC (MISCELLANEOUS) ×4 IMPLANT
CANNULA VESSEL W/WING WO/VALVE (CANNULA) ×4 IMPLANT
COVER MAYO STAND STRL (DRAPES) ×4 IMPLANT
COVER TABLE BACK 60X90 (DRAPES) ×4 IMPLANT
DECANTER SPIKE VIAL GLASS SM (MISCELLANEOUS) ×1 IMPLANT
DRAPE LAPAROTOMY T 102X78X121 (DRAPES) ×4 IMPLANT
DRAPE LG THREE QUARTER DISP (DRAPES) ×4 IMPLANT
DRAPE UTILITY XL STRL (DRAPES) ×3 IMPLANT
DRSG PAD ABDOMINAL 8X10 ST (GAUZE/BANDAGES/DRESSINGS) ×3 IMPLANT
ELECT BLADE 6.5 .24CM SHAFT (ELECTRODE) ×6 IMPLANT
ELECT BLADE TIP CTD 4 INCH (ELECTRODE) ×4 IMPLANT
ELECT REM PT RETURN 9FT ADLT (ELECTROSURGICAL) ×4
ELECTRODE REM PT RTRN 9FT ADLT (ELECTROSURGICAL) ×2 IMPLANT
GAUZE SPONGE 4X4 12PLY STRL (GAUZE/BANDAGES/DRESSINGS) ×2 IMPLANT
GAUZE SPONGE 4X4 16PLY XRAY LF (GAUZE/BANDAGES/DRESSINGS) ×7 IMPLANT
GLOVE BIO SURGEON STRL SZ 6.5 (GLOVE) ×3 IMPLANT
GLOVE BIO SURGEONS STRL SZ 6.5 (GLOVE) ×1
GLOVE BIOGEL PI IND STRL 7.0 (GLOVE) ×2 IMPLANT
GLOVE BIOGEL PI IND STRL 7.5 (GLOVE) ×2 IMPLANT
GLOVE BIOGEL PI INDICATOR 7.0 (GLOVE) ×2
GLOVE BIOGEL PI INDICATOR 7.5 (GLOVE) ×4
GLOVE SURG SS PI 7.5 STRL IVOR (GLOVE) ×3 IMPLANT
GOWN L4 XXLG W/PAP TWL (GOWN DISPOSABLE) ×3 IMPLANT
GOWN STRL REUS W/ TWL XL LVL3 (GOWN DISPOSABLE) ×1 IMPLANT
GOWN STRL REUS W/TWL XL LVL3 (GOWN DISPOSABLE) ×4
IV CATH 14GX2 1/4 (CATHETERS) IMPLANT
LEGGING LITHOTOMY PAIR STRL (DRAPES) ×4 IMPLANT
LUBRICANT JELLY K Y 4OZ (MISCELLANEOUS) ×4 IMPLANT
NDL HYPO 25X1 1.5 SAFETY (NEEDLE) ×1 IMPLANT
NEEDLE HYPO 25X1 1.5 SAFETY (NEEDLE) ×8 IMPLANT
NS IRRIG 1000ML POUR BTL (IV SOLUTION) ×4 IMPLANT
PACK BASIN DAY SURGERY FS (CUSTOM PROCEDURE TRAY) ×4 IMPLANT
PAD ABD 8X10 STRL (GAUZE/BANDAGES/DRESSINGS) ×3 IMPLANT
PENCIL BUTTON HOLSTER BLD 10FT (ELECTRODE) ×4 IMPLANT
SPONGE GAUZE 4X4 12PLY (GAUZE/BANDAGES/DRESSINGS) ×3 IMPLANT
SPONGE GAUZE 4X4 12PLY STER LF (GAUZE/BANDAGES/DRESSINGS) ×4 IMPLANT
SPONGE SURGIFOAM ABS GEL 100 (HEMOSTASIS) IMPLANT
SUCTION FRAZIER TIP 10 FR DISP (SUCTIONS) ×4 IMPLANT
SUT CHROMIC 2 0 SH (SUTURE) ×4 IMPLANT
SUT CHROMIC 3 0 SH 27 (SUTURE) IMPLANT
SUT GUT CHROMIC 3 0 (SUTURE) ×8 IMPLANT
SUT VIC AB 2-0 SH 27 (SUTURE) ×32
SUT VIC AB 2-0 SH 27X BRD (SUTURE) ×9 IMPLANT
SYR 20CC LL (SYRINGE) ×4 IMPLANT
SYR CONTROL 10ML LL (SYRINGE) ×4 IMPLANT
SYRINGE 12CC LL (MISCELLANEOUS) ×4 IMPLANT
TOWEL OR 17X24 6PK STRL BLUE (TOWEL DISPOSABLE) ×8 IMPLANT
TOWEL OR 17X26 10 PK STRL BLUE (TOWEL DISPOSABLE) ×4 IMPLANT
TRAY DSU PREP LF (CUSTOM PROCEDURE TRAY) ×4 IMPLANT
TUBE CONNECTING 12'X1/4 (SUCTIONS) ×1
TUBE CONNECTING 12X1/4 (SUCTIONS) ×3 IMPLANT
YANKAUER SUCT BULB TIP NO VENT (SUCTIONS) ×4 IMPLANT

## 2013-07-20 NOTE — Anesthesia Postprocedure Evaluation (Signed)
  Anesthesia Post-op Note  Patient: [name]  Procedure(s) Performed: Procedure(s) (LRB): MUCOSAL ADVANCEMENT FLAP (N/A)  Patient Location: PACU  Anesthesia Type: General  Level of Consciousness: awake and alert   Airway and Oxygen Therapy: Patient Spontanous Breathing  Post-op Pain: mild  Post-op Assessment: Post-op Vital signs reviewed, Patient's Cardiovascular Status Stable, Respiratory Function Stable, Patent Airway and No signs of Nausea or vomiting  Last Vitals:  Filed Vitals:   07/20/13 1315  BP:   Pulse: 60  Temp:   Resp: 14    Post-op Vital Signs: stable   Complications: No apparent anesthesia complications

## 2013-07-20 NOTE — H&P (View-Only) (Signed)
Chief Complaint   Patient presents with   .  Abdominal Pain   HPI  [name] is a 80 y.o. female.  Abdominal Pain Associated symptoms: constipation and vaginal discharge  She is referred by Dr. Malva LimesMark Anderson for further evaluation and treatment of a possible rectovaginal fistula. She saw my partner Dr Abbey Chattersosenbower last week for  Her daughter is here with her. In April, she developed significant lower abdominal pain was hospitalized with a severe fecal impaction. She underwent some type of radiologic study at that time which sound like a barium enema. Eventually the impaction resolved. She's been on daily MiraLAX since that time. Approximately 2 weeks ago, she started noticing stool passing out her vagina. She spoke with Dr. Ewell PoeAnderson's office this week and he referred her for evaluation. Her bowels are mostly loose. She's not had a problem like this before. No fecaluria. No fevers or chills. No blood per vagina or blood per rectum. She had 5 children via vaginal delivery. No fecal incontinence. Last colonoscopy was 5 years ago.  Past Medical History   Diagnosis  Date   .  Arthritis    .  Hyperlipidemia    .  Hypertension    .  Osteoporosis     Past Surgical History   Procedure  Laterality  Date   .  Gallbladder surgery     .  Eye surgery     History reviewed. No pertinent family history.  Social History  History   Substance Use Topics   .  Smoking status:  Current Every Day Smoker     Types:  Cigarettes   .  Smokeless tobacco:  Not on file   .  Alcohol Use:  No    Allergies   Allergen  Reactions   .  Hydrocodone     Current Outpatient Prescriptions   Medication  Sig  Dispense  Refill   .  amLODipine (NORVASC) 10 MG tablet  Take 10 mg by mouth daily.     Marland Kitchen.  atorvastatin (LIPITOR) 10 MG tablet  Take 10 mg by mouth daily.     .  carvedilol (COREG) 6.25 MG tablet  Take 6.25 mg by mouth 2 (two) times daily with a meal.     .  cloNIDine (CATAPRES) 0.2 MG tablet  Take 0.2 mg by mouth  2 (two) times daily.     Marland Kitchen.  gabapentin (NEURONTIN) 300 MG capsule  Take 300 mg by mouth 3 (three) times daily.     Marland Kitchen.  HYDROmorphone (DILAUDID) 2 MG tablet  Take by mouth every 4 (four) hours as needed for severe pain.     Marland Kitchen.  labetalol (NORMODYNE) 100 MG tablet  Take 100 mg by mouth 2 (two) times daily.     Marland Kitchen.  losartan (COZAAR) 100 MG tablet  Take 100 mg by mouth daily.     .  Polyethylene Glycol 3350 POWD  17 g by Does not apply route.     Marland Kitchen.  spironolactone (ALDACTONE) 25 MG tablet  Take 25 mg by mouth daily.     .  traMADol (ULTRAM) 50 MG tablet  Take by mouth every 6 (six) hours as needed.     .  zolpidem (AMBIEN) 10 MG tablet  Take 10 mg by mouth at bedtime as needed for sleep.      No current facility-administered medications for this visit.   Review of Systems  Review of Systems  Constitutional: Negative.  HENT: Negative.  Respiratory: Negative.  Cardiovascular: Negative.  Gastrointestinal: Positive for abdominal pain and constipation.  Genitourinary: Positive for vaginal discharge.  Neurological: Negative.  Hematological: Bruises/bleeds easily.  Filed Vitals:   07/10/13 0950  BP: 126/80  Pulse: 67  Temp: 97 F (36.1 C)    Physical Exam  Physical Exam  Constitutional: No distress.  End, elderly female  HENT:  Head: Normocephalic and atraumatic.  Abdominal: Soft. She exhibits mild distension. She exhibits no mass. There is no tenderness.  Genitourinary: Vaginal discharge (Feculent) found.  No anal fissures. No palpable and no masses on digital rectal exam. No blood in the rectal vault. Good sphincter tone.  Vaginal exam demonstrated stool on the examining finger. Neurological: She is alert.  Skin: Skin is warm and dry.  Psychiatric: She has a normal mood and affect. Her behavior is normal.  Data Reviewed  CT and MRI reports. CT confirms low rectovaginal fistula  Assessment: [name] is a 81 y.o. F with a rectovaginal fistula, which has been confirmed by CT.   This was most likely due to her episode of severe constipation. Plan  To OR for repair with mucosal advancement flap. She understands that there is a chance for recurrence and the need for more invasive repair in the future.      

## 2013-07-20 NOTE — Op Note (Signed)
07/20/2013  3:50 PM  PATIENT:  Caitlin NeighborBonnie P Windhorst  80 y.o. female  Patient Care Team: Gordan PaymentGreg A. Grisso, MD as PCP - General (Internal Medicine)  PRE-OPERATIVE DIAGNOSIS:  rectovaginal fistual  POST-OPERATIVE DIAGNOSIS:  rectovaginal fistual  PROCEDURE: MUCOSAL ADVANCEMENT FLAP  SURGEON:  Surgeon(s): Romie LeveeAlicia Porfirio Bollier, MD  ASSISTANT: none   ANESTHESIA:   local and general  SPECIMEN:  Source of Specimen:  rectovaginal fistula  DISPOSITION OF SPECIMEN:  PATHOLOGY  COUNTS:  YES  PLAN OF CARE: Discharge to home after PACU  PATIENT DISPOSITION:  PACU - hemodynamically stable.  INDICATION: This is a 80 y.o. F with a rectovaginal fistula that developed after an episode of severe constipation.    OR FINDINGS: rectovaginal fistula ~10cm from anal verge  DESCRIPTION: the patient was identified in the preoperative holding area and taken to the OR where they were laid on the operating room table.  General anesthesia was induced without difficulty. The patient was then positioned in prone jackknife position with buttocks gently taped apart.  The patient was then prepped and draped in usual sterile fashion.  SCDs were noted to be in place prior to the initiation of anesthesia. A surgical timeout was performed indicating the correct patient, procedure, positioning and need for preoperative antibiotics.  I began with a digital rectal exam.  I palpated a vague mass in the rectovaginal septum.  I then placed a Hill-Ferguson anoscope into the anal canal and evaluated this completely.  There was no other pathology identified.  I then placed a longer anoscope into the rectum and identified an area of inflammation.  A fistula probe confirmed this as the site of fistula.  I used electrocautery to make a mucosal flap, resecting the fistula and surrounding scar.  This was sent to pathology for further examination.  I then close the vaginal muscle layer over the fistula site using 2-0 Vicryl sutures.  The area was  irrigated.  Hemostasis was good.  The mucosal layer was then closed using 2-0 Vicryl sutures as well.  I confirmed patency by passing a rigid sigmoidoscope through the area.  I identified a couple small openings that were also closed with 2-0 vicryl sutures.  I inspected the posterior vaginal wall.  I did not see a defect to close there.  I then placed a rectal block with 0.5% Marcaine with epinephrine for post operative pain control.  A dressing was applied.  All counts were correct per OR staff.  The patient was awakened from anesthesia and sent to the PACU in stable condition.

## 2013-07-20 NOTE — Discharge Instructions (Addendum)
ANORECTAL SURGERY: POST OP INSTRUCTIONS °1. Take your usually prescribed home medications unless otherwise directed. °2. DIET: During the first few hours after surgery sip on some liquids until you are able to urinate.  It is normal to not urinate for several hours after this surgery.  If you feel uncomfortable, please contact the office for instructions.  After you are able to urinate,you may eat, if you feel like it.  Follow a light bland diet the first 24 hours after arrival home, such as soup, liquids, crackers, etc.  Be sure to include lots of fluids daily (6-8 glasses).  Avoid fast food or heavy meals, as your are more likely to get nauseated.  Eat a low fat diet the next few days after surgery.  Limit caffeine intake to 1-2 servings a day. °3. PAIN CONTROL: °a. Pain is best controlled by a usual combination of several different methods TOGETHER: °i. Muscle relaxation °1.  Soak in a warm bath (or Sitz bath) three times a day and after bowel movements.  Continue to do this until all pain is resolved. °ii. Over the counter pain medication °iii. Prescription pain medication °b. Most patients will experience some swelling and discomfort in the anus/rectal area and incisions.  Heat such as warm towels, sitz baths, warm baths, etc to help relax tight/sore spots and speed recovery.  Some people prefer to use ice, especially in the first couple days after surgery, as it may decrease the pain and swelling, or alternate between ice & heat.  Experiment to what works for you.  Swelling and bruising can take several weeks to resolve.  Pain can take even longer to completely resolve. °c. It is helpful to take an over-the-counter pain medication regularly for the first few weeks.  Choose one of the following that works best for you: °i. Naproxen (Aleve, etc)  Two 220mg tabs twice a day °ii. Ibuprofen (Advil, etc) Three 200mg tabs four times a day (every meal & bedtime) °d. A  prescription for pain medication (such as  percocet, oxycodone, hydrocodone, etc) should be given to you upon discharge.  Take your pain medication as prescribed.  °i. If you are having problems/concerns with the prescription medicine (does not control pain, nausea, vomiting, rash, itching, etc), please call us [phone] to see if we need to switch you to a different pain medicine that will work better for you and/or control your side effect better. °ii. If you need a refill on your pain medication, please contact your pharmacy.  They will contact our office to request authorization. Prescriptions will not be filled after 5 pm or on week-ends. °4. KEEP YOUR BOWELS REGULAR and AVOID CONSTIPATION °a. The goal is one to two soft bowel movements a day.  You should at least have a bowel movement every other day. °b. Avoid getting constipated.  Between the surgery and the pain medications, it is common to experience some constipation. This can be very painful after rectal surgery.  Increasing fluid intake and taking a fiber supplement (such as Metamucil, Citrucel, FiberCon, etc) 1-2 times a day regularly will usually help prevent this problem from occurring.  A stool softener like colace is also recommended.  This can be purchased over the counter at your pharmacy.  You can take it up to 3 times a day.  If you do not have a bowel movement after 24 hrs since your surgery, take one does of milk of magnesia.  If you still haven't had a bowel movement 8-12   hours after that dose, take another dose.  If you don't have a bowel movement 48 hrs after surgery, purchase a Fleets enema from the drug store and administer gently per package instructions.  If you still are having trouble with your bowel movements after that, please call the office for further instructions. c. If you develop diarrhea or have many loose bowel movements, simplify your diet to bland foods & liquids for a few days.  Stop any stool softeners and decrease your fiber supplement.  Switching to mild  anti-diarrheal medications (Kayopectate, Pepto Bismol) can help.  If this worsens or does not improve, please call us.  5. Wound Care a. Remove your bandages before your first bowel movement or 8 hours after surgery.     b. Remove any wound packing material at this tim,e as well.  You do not need to repack the wound unless instructed otherwise.  Wear an absorbent pad or soft cotton gauze in your underwear to catch any drainage and help keep the area clean. You should change this every 2-3 hours while awake. c. Keep the area clean and dry.  Bathe / shower every day, especially after bowel movements.  Keep the area clean by showering / bathing over the incision / wound.   It is okay to soak an open wound to help wash it.  Wet wipes or showers / gentle washing after bowel movements is often less traumatic than regular toilet paper. d. [name] may have some styrofoam-like soft packing in the rectum which will come out with the first bowel movement.  e. You will often notice bleeding with bowel movements.  This should slow down by the end of the first week of surgery f. Expect some drainage.  This should slow down, too, by the end of the first week of surgery.  Wear an absorbent pad or soft cotton gauze in your underwear until the drainage stops. g. Do Not sit on a rubber or pillow ring.  This can make you symptoms worse.  You may sit on a soft pillow if needed.  6. ACTIVITIES as tolerated:   a. You may resume regular (light) daily activities beginning the next day--such as daily self-care, walking, climbing stairs--gradually increasing activities as tolerated.  If you can walk 30 minutes without difficulty, it is safe to try more intense activity such as jogging, treadmill, bicycling, low-impact aerobics, swimming, etc. b. Save the most intensive and strenuous activity for last such as sit-ups, heavy lifting, contact sports, etc  Refrain from any heavy lifting or straining until you are off narcotics for pain  control.   c. You may drive when you are no longer taking prescription pain medication, you can comfortably sit for long periods of time, and you can safely maneuver your car and apply brakes. d. [name] may have sexual intercourse when it is comfortable.  7. FOLLOW UP in our office a. Please call [facility] at [phone] to set up an appointment to see your surgeon in the office for a follow-up appointment approximately 3-4 weeks after your surgery. b. Make sure that you call for this appointment the day you arrive home to insure a convenient appointment time. 10. IF YOU HAVE DISABILITY OR FAMILY LEAVE FORMS, BRING THEM TO THE OFFICE FOR PROCESSING.  DO NOT GIVE THEM TO YOUR DOCTOR.     WHEN TO CALL us [phone]: 1. Poor pain control 2. Reactions / problems with new medications (rash/itching, nausea, etc)  3. Fever over 100 F  4. Inability to urinate 5. Nausea and/or vomiting 6. Worsening swelling or bruising 7. Continued bleeding from incision. 8. Increased pain, redness, or drainage from the incision  The clinic staff is available to answer your questions during regular business hours (8:30am-5pm).  Please dont hesitate to call and ask to speak to one of our nurses for clinical concerns.   A surgeon from [facility] is always on call at the hospitals   If you have a medical emergency, go to the nearest emergency room or call 911.    [facility] [address], [address], [place], [place]  [address] ? MAIN: [phone] ? TOLL FREE: [phone] ? FAX [phone]) [phone] [email or web address]      Post Anesthesia Home Care Instructions  Activity: Get plenty of rest for the remainder of the day. A responsible adult should stay with you for 24 hours following the procedure.  For the next 24 hours, DO NOT: -Drive a car -Advertising copywriterperate machinery -Drink alcoholic beverages -Take any medication unless instructed by your physician -Make any  legal decisions or sign important papers.  Meals: Start with liquid foods such as gelatin or soup. Progress to regular foods as tolerated. Avoid greasy, spicy, heavy foods. If nausea and/or vomiting occur, drink only clear liquids until the nausea and/or vomiting subsides. Call your physician if vomiting continues.  Special Instructions/Symptoms: Your throat may feel dry or sore from the anesthesia or the breathing tube placed in your throat during surgery. If this causes discomfort, gargle with warm salt water. The discomfort should disappear within 24 hours.

## 2013-07-20 NOTE — Anesthesia Preprocedure Evaluation (Addendum)
Anesthesia Evaluation  Patient identified by MRN, date of birth, ID band Patient awake    Reviewed: Allergy & Precautions, [name] , NPO status , Patient's Chart, lab work & pertinent test results, reviewed documented beta blocker date and time   Airway Mallampati: II TM Distance: >3 FB     Dental  (+) Lower Dentures, Upper Dentures, Dental Advisory Given   Pulmonary Current Smoker,          Cardiovascular Exercise Tolerance: Good hypertension, Pt. on medications and Pt. on home beta blockers Rhythm:Regular Rate:Bradycardia     Neuro/Psych negative neurological ROS  negative psych ROS   GI/Hepatic Neg liver ROS, GERD-  ,Recto-vaginal fistula   Endo/Other  negative endocrine ROS  Renal/GU      Musculoskeletal  (+) Arthritis -,   Abdominal   Peds  Hematology negative hematology ROS (+)   Anesthesia Other Findings   Reproductive/Obstetrics negative OB ROS                      Anesthesia Physical Anesthesia Plan  ASA: II  Anesthesia Plan: General   Post-op Pain Management:    Induction: Intravenous  Airway Management Planned: Oral ETT  Additional Equipment:   Intra-op Plan:   Post-operative Plan: Extubation in OR  Informed Consent:   Dental advisory given  Plan Discussed with: CRNA  Anesthesia Plan Comments:        Anesthesia Quick Evaluation

## 2013-07-20 NOTE — Anesthesia Procedure Notes (Signed)
Procedure Name: Intubation Date/Time: 07/20/2013 11:07 AM Performed by: Tyrone NineSAUVE, Glendine Swetz F Pre-anesthesia Checklist: Timeout performed, Patient identified, Emergency Drugs available, Suction available and Patient being monitored Patient Re-evaluated:Patient Re-evaluated prior to inductionOxygen Delivery Method: Circle system utilized Preoxygenation: Pre-oxygenation with 100% oxygen Intubation Type: IV induction Ventilation: Mask ventilation without difficulty Laryngoscope Size: Mac and 3 Grade View: Grade II Tube size: 7.0 mm Number of attempts: 1 Airway Equipment and Method: Stylet Placement Confirmation: ETT inserted through vocal cords under direct vision,  breath sounds checked- equal and bilateral and positive ETCO2 Secured at: 18 cm Tube secured with: Tape Dental Injury: Teeth and Oropharynx as per pre-operative assessment

## 2013-07-20 NOTE — Interval H&P Note (Signed)
History and Physical Interval Note:  07/20/2013 10:43 AM  [name]  has presented today for surgery, with the diagnosis of rectovaginal fistual  The various methods of treatment have been discussed with the patient and family. After consideration of risks, benefits and other options for treatment, the patient has consented to  Procedure(s) with comments: REPAIR RECTO-VAGINAL FISSURE (N/A) - prone position  MUCOSAL ADVANCEMENT FLAP (N/A) as a surgical intervention .  The patient's history has been reviewed, patient examined, no change in status, stable for surgery.  I have reviewed the patient's chart and labs.  Questions were answered to the patient's satisfaction.    Risks were discussed which include bleeding, infection, recurrence and incontinence/urgency.   Vanita PandaAlicia C Elisandra Deshmukh, MD  Colorectal and General Surgery [facility]

## 2013-07-20 NOTE — Transfer of Care (Signed)
Immediate Anesthesia Transfer of Care Note  Patient: Caitlin NeighborBonnie P Coover  Procedure(s) Performed: Procedure(s): MUCOSAL ADVANCEMENT FLAP (N/A)  Patient Location: PACU  Anesthesia Type:General  Level of Consciousness: awake, alert , oriented and patient cooperative  Airway & Oxygen Therapy: Patient Spontanous Breathing and Patient connected to nasal cannula oxygen  Post-op Assessment: Report given to PACU RN and Post -op Vital signs reviewed and stable  Post vital signs: Reviewed and stable  Complications: No apparent anesthesia complications

## 2013-07-22 ENCOUNTER — Telehealth (INDEPENDENT_AMBULATORY_CARE_PROVIDER_SITE_OTHER): Payer: Self-pay | Admitting: General Surgery

## 2013-07-22 NOTE — Telephone Encounter (Signed)
Having small amts of blood per rectum.  No major brb.  Passed one clot.  Recommended to monitor for now and call back if she develops major bleeding.

## 2013-07-23 ENCOUNTER — Telehealth (INDEPENDENT_AMBULATORY_CARE_PROVIDER_SITE_OTHER): Payer: Self-pay

## 2013-07-23 NOTE — Telephone Encounter (Signed)
Pt s/p rectovaginal fistua on 07/20/13 by Dr Maisie Fushomas. Pts daughter is calling to inform Dr Maisie Fushomas that pt has not had a BM since 07/19/13. Daughter states that she has taken Miralax and MOM twice. Advised daughter to take MOM as directed on the label per protocol, increase water intake, increase ambulation as much as possible. Daughter verbalized understanding and she states that she will contact us back if pt is unable to have a BM in the morning.

## 2013-07-24 ENCOUNTER — Telehealth (INDEPENDENT_AMBULATORY_CARE_PROVIDER_SITE_OTHER): Payer: Self-pay

## 2013-07-24 MED ORDER — NYSTATIN 100000 UNIT/ML MT SUSP: 5.0000 mL | Freq: Four times a day (QID) | OROMUCOSAL | Status: DC

## 2013-07-24 NOTE — Telephone Encounter (Signed)
Daughter calling back today to inform Dr Maisie Fushomas that she has started having stool from her vagina again. Spoke with Dr Maisie Fushomas she states that this could happen after sx. Dr Maisie Fushomas advised for the pt to keep a eye on this. Dr Maisie Fushomas had explained to family and pt that there was a possibly that sx would not work. Informed daughter of Dr Maurine Ministerhomas's advise. Daughter verbalized understanding.

## 2013-07-24 NOTE — Telephone Encounter (Signed)
Pt s/p rectovaginal fistua on 07/20/13 by Dr Maisie Fushomas. Pt has been taking Cipro and Flagyl. Pt states that she has started having Thrush in her mouth. Pt would like to know if there was anything that she can take OTC or if Dr Maisie Fushomas can call something in to her pharmacy. Pt states that she has been doing regular mouthwash with no relief. Informed pt that Dr Maisie Fushomas is unavailable however I would send this to our urgent office physician. We will contact the pt as soon as we receive a response. Pt verbalized understanding and agrees with POC.

## 2013-07-24 NOTE — Telephone Encounter (Signed)
Informed pt Dr Andrey CampanileWilson has ordered her Nystatin 100000 U/ml suspension. Rx was called into her pharmacy. Pt verbalized understanding

## 2013-07-24 NOTE — Telephone Encounter (Addendum)
Will do nystatin swish and swallow. Cannot e prescribe. Will need to pick up or call in

## 2013-07-24 NOTE — Addendum Note (Signed)
Addended by: Andrey CampanileWILSON, Sahana Boyland M on: 07/24/2013 03:45 PM   Modules accepted: Orders

## 2013-07-30 ENCOUNTER — Telehealth (INDEPENDENT_AMBULATORY_CARE_PROVIDER_SITE_OTHER): Payer: Self-pay

## 2013-07-30 MED ORDER — AMOXICILLIN-POT CLAVULANATE 875-125 MG PO TABS: 1.0000 | ORAL_TABLET | Freq: Two times a day (BID) | ORAL | Status: AC

## 2013-07-30 NOTE — Telephone Encounter (Signed)
Patients daughter calling into office to report that patient is having watery stools.  Patient has bright red blood from both vagina and rectum.  Patient reports when she coughs she has bloody discharge from rectum.  Patient had lower abdominal pain over the weekend.  Patient denies any fevers, nausea or vomiting.  Daughter concerned and would like a call back.  Patient has history of Rectovaginal Fistula.  Patient status post mucosal Advancement Flap 07/20/13.

## 2013-07-30 NOTE — Telephone Encounter (Signed)
Spoke with pt's daughter and informed her that after consulting with Dr. Maisie Fushomas, this is normal and was slightly expected.  It sounds like it is not healing as well and as quickly as we would like.  Informed her that she should continue to tuck a pad in the underwear to help catch the bleeding as well as to help place some mild pressure.  Informed her that Dr. Maisie Fushomas would like to put her on  prophylactic antibiotic as well.  [facility] [place].  Informed them that if for any reason the bleeding or abdominal pain gets worse they need to call back into the office for us to re-evaluate the POC.  Also gave them the appt information of 08/07/13 at 9:30 w/ Dr. Maisie Fushomas next week to be rechecked.

## 2013-07-30 NOTE — Addendum Note (Signed)
Addended byIgnacia Marvel: MOFFITT, KENDALL on: 07/30/2013 11:49 AM   Modules accepted: Orders

## 2013-08-07 ENCOUNTER — Encounter (INDEPENDENT_AMBULATORY_CARE_PROVIDER_SITE_OTHER): Payer: Self-pay | Admitting: General Surgery

## 2013-08-07 ENCOUNTER — Ambulatory Visit (INDEPENDENT_AMBULATORY_CARE_PROVIDER_SITE_OTHER): Payer: Medicare Other | Admitting: General Surgery

## 2013-08-07 VITALS — BP 134/80 | HR 53 | Temp 97.6°F | Ht 61.0 in | Wt 113.0 lb

## 2013-08-07 DIAGNOSIS — Z9889 Other specified postprocedural states: Secondary | ICD-10-CM

## 2013-08-07 NOTE — Progress Notes (Signed)
[name] is a 80 y.o. female who is status post a MAF for RVF on 07/20/13.  She has had some bleeding and vaginal drainage, but this is getting better.  She is using Miralax to help with BM's  Objective: Filed Vitals:   08/07/13 0921  BP: 134/80  Pulse: 53  Temp: 97.6 F (36.4 C)    General appearance: alert and cooperative GI: normal findings: soft, non-tender  Incision: healing well   Assessment: s/p  Patient Active Problem List   Diagnosis Date Noted  . Rectovaginal fistula 07/04/2013    Plan: RTO in 3 weeks for recheck.      Rosario Adie, [facility], [place] [phone]   08/07/2013 9:41 AM

## 2013-08-31 ENCOUNTER — Encounter (INDEPENDENT_AMBULATORY_CARE_PROVIDER_SITE_OTHER): Payer: Medicare Other | Admitting: General Surgery

## 2013-09-27 ENCOUNTER — Encounter (INDEPENDENT_AMBULATORY_CARE_PROVIDER_SITE_OTHER): Payer: Medicare Other | Admitting: General Surgery

## 2014-08-08 ENCOUNTER — Ambulatory Visit
Admission: RE | Admit: 2014-08-08 | Discharge: 2014-08-08 | Disposition: A | Discharge status: Home / Self Care | Payer: Medicare Other | Source: Ambulatory Visit | Attending: Student | Admitting: Student

## 2014-08-08 ENCOUNTER — Other Ambulatory Visit: Payer: Self-pay | Admitting: Student

## 2014-08-08 DIAGNOSIS — M1990 Unspecified osteoarthritis, unspecified site: Secondary | ICD-10-CM

## 2017-11-09 DIAGNOSIS — J449 Chronic obstructive pulmonary disease, unspecified: Secondary | ICD-10-CM

## 2017-11-09 DIAGNOSIS — I352 Nonrheumatic aortic (valve) stenosis with insufficiency: Secondary | ICD-10-CM

## 2017-11-09 DIAGNOSIS — I340 Nonrheumatic mitral (valve) insufficiency: Secondary | ICD-10-CM

## 2017-11-09 DIAGNOSIS — E785 Hyperlipidemia, unspecified: Secondary | ICD-10-CM

## 2017-11-09 DIAGNOSIS — I10 Essential (primary) hypertension: Secondary | ICD-10-CM | POA: Diagnosis not present

## 2017-11-09 DIAGNOSIS — I701 Atherosclerosis of renal artery: Secondary | ICD-10-CM

## 2017-11-09 DIAGNOSIS — I2510 Atherosclerotic heart disease of native coronary artery without angina pectoris: Secondary | ICD-10-CM

## 2017-11-09 DIAGNOSIS — R51 Headache: Secondary | ICD-10-CM

## 2017-11-09 DIAGNOSIS — R55 Syncope and collapse: Secondary | ICD-10-CM | POA: Diagnosis not present

## 2017-11-09 DIAGNOSIS — R7303 Prediabetes: Secondary | ICD-10-CM

## 2017-11-09 DIAGNOSIS — I361 Nonrheumatic tricuspid (valve) insufficiency: Secondary | ICD-10-CM

## 2017-11-10 DIAGNOSIS — J449 Chronic obstructive pulmonary disease, unspecified: Secondary | ICD-10-CM | POA: Diagnosis not present

## 2017-11-10 DIAGNOSIS — I10 Essential (primary) hypertension: Secondary | ICD-10-CM | POA: Diagnosis not present

## 2017-11-10 DIAGNOSIS — E785 Hyperlipidemia, unspecified: Secondary | ICD-10-CM | POA: Diagnosis not present

## 2017-11-10 DIAGNOSIS — I701 Atherosclerosis of renal artery: Secondary | ICD-10-CM | POA: Diagnosis not present

## 2018-02-20 DIAGNOSIS — I4891 Unspecified atrial fibrillation: Secondary | ICD-10-CM

## 2018-02-20 DIAGNOSIS — I10 Essential (primary) hypertension: Secondary | ICD-10-CM | POA: Diagnosis not present

## 2018-02-20 DIAGNOSIS — S22070A Wedge compression fracture of T9-T10 vertebra, initial encounter for closed fracture: Secondary | ICD-10-CM

## 2018-02-20 DIAGNOSIS — I509 Heart failure, unspecified: Secondary | ICD-10-CM | POA: Diagnosis not present

## 2018-02-21 DIAGNOSIS — I509 Heart failure, unspecified: Secondary | ICD-10-CM | POA: Diagnosis not present

## 2018-02-21 DIAGNOSIS — S22070A Wedge compression fracture of T9-T10 vertebra, initial encounter for closed fracture: Secondary | ICD-10-CM | POA: Diagnosis not present

## 2018-02-21 DIAGNOSIS — I10 Essential (primary) hypertension: Secondary | ICD-10-CM | POA: Diagnosis not present

## 2018-02-21 DIAGNOSIS — I4891 Unspecified atrial fibrillation: Secondary | ICD-10-CM | POA: Diagnosis not present

## 2018-03-11 DIAGNOSIS — I351 Nonrheumatic aortic (valve) insufficiency: Secondary | ICD-10-CM | POA: Diagnosis not present

## 2018-03-11 DIAGNOSIS — J440 Chronic obstructive pulmonary disease with acute lower respiratory infection: Secondary | ICD-10-CM

## 2018-03-11 DIAGNOSIS — I340 Nonrheumatic mitral (valve) insufficiency: Secondary | ICD-10-CM | POA: Diagnosis not present

## 2018-03-11 DIAGNOSIS — I110 Hypertensive heart disease with heart failure: Secondary | ICD-10-CM

## 2018-03-11 DIAGNOSIS — I4891 Unspecified atrial fibrillation: Secondary | ICD-10-CM

## 2018-03-11 DIAGNOSIS — J9691 Respiratory failure, unspecified with hypoxia: Secondary | ICD-10-CM

## 2018-03-11 DIAGNOSIS — M810 Age-related osteoporosis without current pathological fracture: Secondary | ICD-10-CM

## 2018-03-11 DIAGNOSIS — J189 Pneumonia, unspecified organism: Secondary | ICD-10-CM | POA: Diagnosis not present

## 2018-03-11 DIAGNOSIS — I361 Nonrheumatic tricuspid (valve) insufficiency: Secondary | ICD-10-CM

## 2018-03-11 DIAGNOSIS — A419 Sepsis, unspecified organism: Secondary | ICD-10-CM | POA: Diagnosis not present

## 2018-03-11 DIAGNOSIS — E1165 Type 2 diabetes mellitus with hyperglycemia: Secondary | ICD-10-CM

## 2018-03-11 DIAGNOSIS — J441 Chronic obstructive pulmonary disease with (acute) exacerbation: Secondary | ICD-10-CM

## 2018-03-11 DIAGNOSIS — I5033 Acute on chronic diastolic (congestive) heart failure: Secondary | ICD-10-CM

## 2018-03-12 DIAGNOSIS — J9691 Respiratory failure, unspecified with hypoxia: Secondary | ICD-10-CM | POA: Diagnosis not present

## 2018-03-12 DIAGNOSIS — J189 Pneumonia, unspecified organism: Secondary | ICD-10-CM | POA: Diagnosis not present

## 2018-03-12 DIAGNOSIS — A419 Sepsis, unspecified organism: Secondary | ICD-10-CM | POA: Diagnosis not present

## 2018-03-12 DIAGNOSIS — I110 Hypertensive heart disease with heart failure: Secondary | ICD-10-CM | POA: Diagnosis not present

## 2018-03-13 DIAGNOSIS — J189 Pneumonia, unspecified organism: Secondary | ICD-10-CM | POA: Diagnosis not present

## 2018-03-13 DIAGNOSIS — J9691 Respiratory failure, unspecified with hypoxia: Secondary | ICD-10-CM | POA: Diagnosis not present

## 2018-03-13 DIAGNOSIS — I110 Hypertensive heart disease with heart failure: Secondary | ICD-10-CM | POA: Diagnosis not present

## 2018-03-13 DIAGNOSIS — A419 Sepsis, unspecified organism: Secondary | ICD-10-CM | POA: Diagnosis not present

## 2018-03-14 DIAGNOSIS — J189 Pneumonia, unspecified organism: Secondary | ICD-10-CM | POA: Diagnosis not present

## 2018-03-14 DIAGNOSIS — I110 Hypertensive heart disease with heart failure: Secondary | ICD-10-CM | POA: Diagnosis not present

## 2018-03-14 DIAGNOSIS — J9691 Respiratory failure, unspecified with hypoxia: Secondary | ICD-10-CM | POA: Diagnosis not present

## 2018-03-14 DIAGNOSIS — A419 Sepsis, unspecified organism: Secondary | ICD-10-CM | POA: Diagnosis not present

## 2018-04-10 ENCOUNTER — Inpatient Hospital Stay (HOSPITAL_COMMUNITY)
Admission: EM | Admit: 2018-04-10 | Discharge: 2018-04-14 | DRG: 811 | Disposition: A | Discharge status: Hospice | Payer: Medicare Other | Attending: Family Medicine | Admitting: Family Medicine

## 2018-04-10 ENCOUNTER — Emergency Department (HOSPITAL_COMMUNITY): Payer: Medicare Other

## 2018-04-10 ENCOUNTER — Encounter (HOSPITAL_COMMUNITY): Payer: Self-pay | Admitting: Emergency Medicine

## 2018-04-10 ENCOUNTER — Other Ambulatory Visit: Payer: Self-pay

## 2018-04-10 DIAGNOSIS — Z9841 Cataract extraction status, right eye: Secondary | ICD-10-CM | POA: Diagnosis not present

## 2018-04-10 DIAGNOSIS — I712 Thoracic aortic aneurysm, without rupture: Secondary | ICD-10-CM | POA: Diagnosis present

## 2018-04-10 DIAGNOSIS — I5032 Chronic diastolic (congestive) heart failure: Secondary | ICD-10-CM | POA: Diagnosis present

## 2018-04-10 DIAGNOSIS — Z885 Allergy status to narcotic agent status: Secondary | ICD-10-CM

## 2018-04-10 DIAGNOSIS — I959 Hypotension, unspecified: Secondary | ICD-10-CM

## 2018-04-10 DIAGNOSIS — F17210 Nicotine dependence, cigarettes, uncomplicated: Secondary | ICD-10-CM | POA: Diagnosis present

## 2018-04-10 DIAGNOSIS — I110 Hypertensive heart disease with heart failure: Secondary | ICD-10-CM | POA: Diagnosis present

## 2018-04-10 DIAGNOSIS — E871 Hypo-osmolality and hyponatremia: Secondary | ICD-10-CM | POA: Diagnosis present

## 2018-04-10 DIAGNOSIS — K922 Gastrointestinal hemorrhage, unspecified: Secondary | ICD-10-CM

## 2018-04-10 DIAGNOSIS — F0390 Unspecified dementia without behavioral disturbance: Secondary | ICD-10-CM | POA: Diagnosis present

## 2018-04-10 DIAGNOSIS — G4700 Insomnia, unspecified: Secondary | ICD-10-CM | POA: Diagnosis present

## 2018-04-10 DIAGNOSIS — I482 Chronic atrial fibrillation, unspecified: Secondary | ICD-10-CM | POA: Diagnosis present

## 2018-04-10 DIAGNOSIS — Z9842 Cataract extraction status, left eye: Secondary | ICD-10-CM

## 2018-04-10 DIAGNOSIS — R531 Weakness: Secondary | ICD-10-CM | POA: Diagnosis present

## 2018-04-10 DIAGNOSIS — E11649 Type 2 diabetes mellitus with hypoglycemia without coma: Secondary | ICD-10-CM | POA: Diagnosis not present

## 2018-04-10 DIAGNOSIS — R5383 Other fatigue: Secondary | ICD-10-CM

## 2018-04-10 DIAGNOSIS — Z7901 Long term (current) use of anticoagulants: Secondary | ICD-10-CM

## 2018-04-10 DIAGNOSIS — D62 Acute posthemorrhagic anemia: Secondary | ICD-10-CM | POA: Diagnosis present

## 2018-04-10 DIAGNOSIS — Z794 Long term (current) use of insulin: Secondary | ICD-10-CM

## 2018-04-10 DIAGNOSIS — I2510 Atherosclerotic heart disease of native coronary artery without angina pectoris: Secondary | ICD-10-CM | POA: Diagnosis present

## 2018-04-10 DIAGNOSIS — J189 Pneumonia, unspecified organism: Secondary | ICD-10-CM

## 2018-04-10 DIAGNOSIS — Z9049 Acquired absence of other specified parts of digestive tract: Secondary | ICD-10-CM

## 2018-04-10 DIAGNOSIS — E785 Hyperlipidemia, unspecified: Secondary | ICD-10-CM | POA: Diagnosis present

## 2018-04-10 DIAGNOSIS — I10 Essential (primary) hypertension: Secondary | ICD-10-CM | POA: Diagnosis present

## 2018-04-10 DIAGNOSIS — K921 Melena: Secondary | ICD-10-CM | POA: Diagnosis present

## 2018-04-10 DIAGNOSIS — R0902 Hypoxemia: Secondary | ICD-10-CM | POA: Diagnosis not present

## 2018-04-10 DIAGNOSIS — D649 Anemia, unspecified: Secondary | ICD-10-CM

## 2018-04-10 DIAGNOSIS — M810 Age-related osteoporosis without current pathological fracture: Secondary | ICD-10-CM | POA: Diagnosis present

## 2018-04-10 DIAGNOSIS — Z961 Presence of intraocular lens: Secondary | ICD-10-CM | POA: Diagnosis present

## 2018-04-10 DIAGNOSIS — R001 Bradycardia, unspecified: Secondary | ICD-10-CM | POA: Diagnosis present

## 2018-04-10 DIAGNOSIS — M160 Bilateral primary osteoarthritis of hip: Secondary | ICD-10-CM | POA: Diagnosis present

## 2018-04-10 DIAGNOSIS — Z66 Do not resuscitate: Secondary | ICD-10-CM | POA: Diagnosis present

## 2018-04-10 DIAGNOSIS — R571 Hypovolemic shock: Secondary | ICD-10-CM | POA: Diagnosis present

## 2018-04-10 DIAGNOSIS — T68XXXA Hypothermia, initial encounter: Secondary | ICD-10-CM | POA: Diagnosis not present

## 2018-04-10 DIAGNOSIS — Z79899 Other long term (current) drug therapy: Secondary | ICD-10-CM

## 2018-04-10 DIAGNOSIS — E875 Hyperkalemia: Secondary | ICD-10-CM | POA: Diagnosis present

## 2018-04-10 DIAGNOSIS — Z515 Encounter for palliative care: Secondary | ICD-10-CM | POA: Diagnosis present

## 2018-04-10 HISTORY — DX: Unspecified atrial fibrillation: I48.91

## 2018-04-10 LAB — POCT I-STAT EG7
Acid-Base Excess: 10 mmol/L — ABNORMAL HIGH (ref 0.0–2.0)
Bicarbonate: 33.9 mmol/L — ABNORMAL HIGH (ref 20.0–28.0)
Calcium, Ion: 1.15 mmol/L (ref 1.15–1.40)
HCT: 18 % — ABNORMAL LOW (ref 36.0–46.0)
Hemoglobin: 6.1 g/dL — CL (ref 12.0–15.0)
O2 Saturation: 82 %
Potassium: 5.6 mmol/L — ABNORMAL HIGH (ref 3.5–5.1)
Sodium: 129 mmol/L — ABNORMAL LOW (ref 135–145)
TCO2: 35 mmol/L — ABNORMAL HIGH (ref 22–32)
pCO2, Ven: 44.2 mmHg (ref 44.0–60.0)
pH, Ven: 7.492 — ABNORMAL HIGH (ref 7.250–7.430)
pO2, Ven: 43 mmHg (ref 32.0–45.0)

## 2018-04-10 LAB — CBC WITH DIFFERENTIAL/PLATELET
Abs Immature Granulocytes: 0.23 10*3/uL — ABNORMAL HIGH (ref 0.00–0.07)
Basophils Absolute: 0 10*3/uL (ref 0.0–0.1)
Basophils Relative: 0 %
Eosinophils Absolute: 0 10*3/uL (ref 0.0–0.5)
Eosinophils Relative: 0 %
HCT: 22.2 % — ABNORMAL LOW (ref 36.0–46.0)
Hemoglobin: 6.8 g/dL — CL (ref 12.0–15.0)
Immature Granulocytes: 3 %
Lymphocytes Relative: 6 %
Lymphs Abs: 0.4 10*3/uL — ABNORMAL LOW (ref 0.7–4.0)
MCH: 28.6 pg (ref 26.0–34.0)
MCHC: 30.6 g/dL (ref 30.0–36.0)
MCV: 93.3 fL (ref 80.0–100.0)
Monocytes Absolute: 0.7 10*3/uL (ref 0.1–1.0)
Monocytes Relative: 10 %
Neutro Abs: 5.6 10*3/uL (ref 1.7–7.7)
Neutrophils Relative %: 81 %
Platelets: 53 10*3/uL — ABNORMAL LOW (ref 150–400)
RBC: 2.38 MIL/uL — ABNORMAL LOW (ref 3.87–5.11)
RDW: 17.6 % — ABNORMAL HIGH (ref 11.5–15.5)
WBC: 6.9 10*3/uL (ref 4.0–10.5)
nRBC: 1.3 % — ABNORMAL HIGH (ref 0.0–0.2)

## 2018-04-10 LAB — BRAIN NATRIURETIC PEPTIDE: B Natriuretic Peptide: 335.9 pg/mL — ABNORMAL HIGH (ref 0.0–100.0)

## 2018-04-10 LAB — COMPREHENSIVE METABOLIC PANEL
ALT: 126 U/L — ABNORMAL HIGH (ref 0–44)
AST: 136 U/L — ABNORMAL HIGH (ref 15–41)
Albumin: 2.1 g/dL — ABNORMAL LOW (ref 3.5–5.0)
Alkaline Phosphatase: 170 U/L — ABNORMAL HIGH (ref 38–126)
Anion gap: 8 (ref 5–15)
BUN: 60 mg/dL — ABNORMAL HIGH (ref 8–23)
CO2: 30 mmol/L (ref 22–32)
Calcium: 8.4 mg/dL — ABNORMAL LOW (ref 8.9–10.3)
Chloride: 93 mmol/L — ABNORMAL LOW (ref 98–111)
Creatinine, Ser: 0.87 mg/dL (ref 0.44–1.00)
GFR calc Af Amer: >60 mL/min (ref >60)
GFR calc non Af Amer: >60 mL/min (ref >60)
Glucose, Bld: 123 mg/dL — ABNORMAL HIGH (ref 70–99)
Potassium: 5.4 mmol/L — ABNORMAL HIGH (ref 3.5–5.1)
Sodium: 131 mmol/L — ABNORMAL LOW (ref 135–145)
Total Bilirubin: 0.4 mg/dL (ref 0.3–1.2)
Total Protein: 5 g/dL — ABNORMAL LOW (ref 6.5–8.1)

## 2018-04-10 LAB — PROTIME-INR
INR: 1.1 (ref 0.8–1.2)
Prothrombin Time: 14 seconds (ref 11.4–15.2)

## 2018-04-10 LAB — PREPARE RBC (CROSSMATCH)

## 2018-04-10 LAB — I-STAT TROPONIN, ED: Troponin i, poc: 0.01 ng/mL (ref 0.00–0.08)

## 2018-04-10 MED ORDER — SODIUM CHLORIDE 0.9 % IV SOLN
80.0000 mg | Freq: Once | INTRAVENOUS | Status: AC
  Administered 2018-04-10: 80 mg via INTRAVENOUS
  Filled 2018-04-10: qty 80

## 2018-04-10 MED ORDER — SODIUM CHLORIDE 0.9 % IV BOLUS
1000.0000 mL | Freq: Once | INTRAVENOUS | Status: AC
  Administered 2018-04-10: 23:00:00 1000 mL via INTRAVENOUS

## 2018-04-10 MED ORDER — SODIUM CHLORIDE 0.9 % IV SOLN
8.0000 mg/h | INTRAVENOUS | Status: DC
  Administered 2018-04-10 – 2018-04-13 (×6): 8 mg/h via INTRAVENOUS
  Filled 2018-04-10 (×9): qty 80

## 2018-04-10 MED ORDER — SODIUM CHLORIDE 0.9 % IV SOLN
10.0000 mL/h | Freq: Once | INTRAVENOUS | Status: AC
  Administered 2018-04-11: 10 mL/h via INTRAVENOUS

## 2018-04-10 MED ORDER — SODIUM CHLORIDE 0.9 % IV BOLUS: 500.0000 mL | Freq: Once | INTRAVENOUS | Status: DC

## 2018-04-10 NOTE — ED Notes (Signed)
Bed: AU63 Expected date:  Expected time:  Means of arrival:  Comments: EMS 85 yo female from [facility] lethargic and hypotension

## 2018-04-10 NOTE — ED Notes (Signed)
POC occult blood POSITIVE  

## 2018-04-10 NOTE — H&P (Signed)
History and Physical   [name] PET:[id number] DOB: Oct 14, 1932 DOA: 04/10/2018  Referring MD/NP/PA: Dr. Silverio Lay  PCP: Gordan Payment., MD   Outpatient Specialists: None  Patient coming from: [facility] nursing facility  Chief Complaint: Weakness and fatigue  HPI: [name] is a 85 y.o. female with medical history significant of A. fib on Eliquis, hypertension, hyperlipidemia, osteoporosis, history of rectovaginal fistula and osteoarthritis who presents from nursing home secondary to generalized weakness fatigue and hemoglobin low at 7.3.  Patient was also noted to be hypotensive and hypoxic requiring oxygen.  She was on 2 L/min but now requiring 4 L.  She was having exertional dyspnea.  Patient was seen in the ER with a hemoglobin dropping to 6.8 and 6.1.  She apparently has been having melena which is guaiac positive.  She is being admitted therefore to medical floor for slow GI bleed with symptomatic anemia.  No abdominal pain.  Denied taking nonsteroidal anti-inflammatory agents..  ED Course: Temperature 97.6, blood pressure is 81/57 pulse 61 respiratory 26 oxygen sats 94% on 4 L.  White count is 6.9 with hemoglobin 6.1 and platelets 53.  Sodium 129 potassium 5.6 chloride 93 BUN is 16 creatinine 0.87 glucose 123 calcium 8.4.  Stool guaiac is positive.  PT 14 INR 1.1.  Chest x-ray showed no active disease and head CT without contrast is negative.  LFTs are elevated.  Urinalysis showed mild leukocytes but asymptomatic.  Review of Systems: As per HPI otherwise 10 point review of systems negative.    Past Medical History:  Diagnosis Date  . Arthritis    HIPS  . Full dentures   . Hyperlipidemia   . Hypertension   . Osteoporosis   . Rectovaginal fistula   . Renal cyst     Past Surgical History:  Procedure Laterality Date  . CATARACT EXTRACTION W/ INTRAOCULAR LENS  IMPLANT, BILATERAL  2015  . CHOLECYSTECTOMY  2007  . COLONOSCOPY  2010  . EXCISION BENIGN LEFT BREAST CYST  YRS AGO      reports that she has been smoking cigarettes. She has a 3.00 pack-year smoking history. She has never used smokeless tobacco. She reports that she does not drink alcohol or use drugs.  Allergies  Allergen Reactions  . Oxycodone Other (See Comments)    HALLUCINATES    No family history on file.   Prior to Admission medications   Medication Sig Start Date End Date Taking? Authorizing Provider  amLODipine (NORVASC) 10 MG tablet Take 10 mg by mouth daily.    [provider]  atorvastatin (LIPITOR) 10 MG tablet Take 10 mg by mouth every evening.     [provider]  carvedilol (COREG) 6.25 MG tablet Take 6.25 mg by mouth 2 (two) times daily with a meal. PT CURRENTLY IS TAKING 3.25 BID    [provider]  ciprofloxacin (CIPRO) 500 MG tablet Take 1 tablet (500 mg total) by mouth 2 (two) times daily. 07/20/13   Romie Levee, MD  cloNIDine (CATAPRES) 0.2 MG tablet Take 0.2 mg by mouth 2 (two) times daily.    [provider]  gabapentin (NEURONTIN) 300 MG capsule Take 300 mg by mouth 3 (three) times daily.    [provider]  HYDROcodone-acetaminophen (NORCO/VICODIN) 5-325 MG per tablet Take 1 tablet by mouth every 6 (six) hours as needed. 07/20/13   Romie Levee, MD  labetalol (NORMODYNE) 100 MG tablet Take 100 mg by mouth 2 (two) times daily.    [provider]  losartan (COZAAR) 100 MG tablet Take 100 mg by mouth daily.    [provider]  metroNIDAZOLE (FLAGYL) 500 MG tablet Take 1 tablet (500 mg total) by mouth 3 (three) times daily. 07/20/13   Romie Levee, MD  nystatin (MYCOSTATIN) 100000 UNIT/ML suspension Take 5 mLs (500,000 Units total) by mouth 4 (four) times daily. 07/24/13   Gaynelle Adu, MD  Omega-3 Fatty Acids (FISH OIL) 1000 MG CAPS Take 1 capsule by mouth daily.    [provider]  polyethylene glycol (MIRALAX / GLYCOLAX) packet Take 17 g by mouth daily.    [provider]  spironolactone  (ALDACTONE) 25 MG tablet Take 25 mg by mouth daily. LAST DOSE TAKEN OVER 4 MONTHS AGO    [provider]  Wheat Dextrin (BENEFIBER DRINK MIX PO) Take by mouth daily.    [provider]  zolpidem (AMBIEN) 10 MG tablet Take 10 mg by mouth at bedtime.     [provider]    Physical Exam: Vitals:   04/10/18 2136 04/10/18 2137 04/10/18 2200 04/10/18 2231  BP:   (!) 94/49 (!) 81/57  Pulse:   60 61  Resp:   (!) 26 (!) 25  Temp: 97.6 F (36.4 C)     TempSrc: Oral     SpO2: (!) 4%  100% 98%  Weight:  47.2 kg    Height:  5\' 1"  (1.549 m)        Constitutional: NAD, calm, comfortable Vitals:   04/10/18 2136 04/10/18 2137 04/10/18 2200 04/10/18 2231  BP:   (!) 94/49 (!) 81/57  Pulse:   60 61  Resp:   (!) 26 (!) 25  Temp: 97.6 F (36.4 C)     TempSrc: Oral     SpO2: (!) 4%  100% 98%  Weight:  47.2 kg    Height:  5\' 1"  (1.549 m)     Eyes: PERRL, lids and conjunctivae pale ENMT: Mucous membranes are moist. Posterior pharynx clear of any exudate or lesions.Normal dentition.  Neck: normal, supple, no masses, no thyromegaly Respiratory: clear to auscultation bilaterally, no wheezing, mild diffuse crackles. Normal respiratory effort. No accessory muscle use.  Cardiovascular: Regular rate and rhythm, no murmurs / rubs / gallops. No extremity edema. 2+ pedal pulses. No carotid bruits.  Abdomen: no tenderness, no masses palpated. No hepatosplenomegaly. Bowel sounds positive.  Musculoskeletal: no clubbing / cyanosis. No joint deformity upper and lower extremities. Good ROM, no contractures. Normal muscle tone.  Skin: no rashes, lesions, ulcers. No induration Neurologic: CN 2-12 grossly intact. Sensation intact, DTR normal. Strength 5/5 in all 4.  Psychiatric: Normal judgment and insight. Alert and oriented x 3. Normal mood.     Labs on Admission: I have personally reviewed following labs and imaging studies  CBC: Recent Labs  Lab 04/10/18 2207 04/10/18 2220   WBC 6.9  --   NEUTROABS 5.6  --   HGB 6.8* 6.1*  HCT 22.2* 18.0*  MCV 93.3  --   PLT 53*  --    Basic Metabolic Panel: Recent Labs  Lab 04/10/18 2207 04/10/18 2220  NA 131* 129*  K 5.4* 5.6*  CL 93*  --   CO2 30  --   GLUCOSE 123*  --   BUN 60*  --   CREATININE 0.87  --   CALCIUM 8.4*  --    GFR: Estimated Creatinine Clearance: 35.9 mL/min (by C-G formula based on SCr of 0.87 mg/dL). Liver Function Tests: Recent Labs  Lab 04/10/18 2207  AST 136*  ALT 126*  ALKPHOS 170*  BILITOT 0.4  PROT 5.0*  ALBUMIN 2.1*   No results for input(s): LIPASE, AMYLASE in the last 168 hours. No results for input(s): AMMONIA in the last 168 hours. Coagulation Profile: Recent Labs  Lab 04/10/18 2207  INR 1.1   Cardiac Enzymes: No results for input(s): CKTOTAL, CKMB, CKMBINDEX, TROPONINI in the last 168 hours. BNP (last 3 results) No results for input(s): PROBNP in the last 8760 hours. HbA1C: No results for input(s): HGBA1C in the last 72 hours. CBG: No results for input(s): GLUCAP in the last 168 hours. Lipid Profile: No results for input(s): CHOL, HDL, LDLCALC, TRIG, CHOLHDL, LDLDIRECT in the last 72 hours. Thyroid Function Tests: No results for input(s): TSH, T4TOTAL, FREET4, T3FREE, THYROIDAB in the last 72 hours. Anemia Panel: No results for input(s): VITAMINB12, FOLATE, FERRITIN, TIBC, IRON, RETICCTPCT in the last 72 hours. Urine analysis:    Component Value Date/Time   COLORURINE YELLOW 02/24/2010 2328   APPEARANCEUR TURBID (A) 02/24/2010 2328   LABSPEC 1.016 02/24/2010 2328   PHURINE 7.0 02/24/2010 2328   GLUCOSEU NEGATIVE 09/01/2008 1702   HGBUR SMALL (A) 02/24/2010 2328   BILIRUBINUR NEGATIVE 02/24/2010 2328   KETONESUR 15 (A) 02/24/2010 2328   PROTEINUR NEGATIVE 02/24/2010 2328   UROBILINOGEN 1.0 02/24/2010 2328   NITRITE NEGATIVE 02/24/2010 2328   LEUKOCYTESUR SMALL (A) 02/24/2010 2328   Sepsis Labs: @LABRCNTIP (procalcitonin:4,lacticidven:4) )No results  found for this or any previous visit (from the past 240 hour(s)).   Radiological Exams on Admission: Ct Head Wo Contrast  Result Date: 04/10/2018 CLINICAL DATA:  Altered level of consciousness. Weakness and lethargy. EXAM: CT HEAD WITHOUT CONTRAST TECHNIQUE: Contiguous axial images were obtained from the base of the skull through the vertex without intravenous contrast. COMPARISON:  Head CT 11/09/2017 FINDINGS: Brain: No intracranial hemorrhage, mass effect, or midline shift. Brain volume is normal for age. No hydrocephalus. The basilar cisterns are patent. No evidence of territorial infarct or acute ischemia. No extra-axial or intracranial fluid collection. Vascular: Atherosclerosis of skullbase vasculature without hyperdense vessel or abnormal calcification. Skull: No fracture or focal lesion. Sinuses/Orbits: No acute findings. Bilateral cataract resection. Other: Multiple small metallic densities in the left temporal scalp in just lateral to the superior left orbit are new from prior exam, no associated air. IMPRESSION: 1. No acute intracranial abnormality. 2. Punctate metallic densities in the left frontal scalp soft tissue are new from November 2019 but do not appear acute. Electronically Signed   By: Narda RutherfordMelanie  Sanford M.D.   On: 04/10/2018 22:46   Dg Chest Port 1 View  Result Date: 04/10/2018 CLINICAL DATA:  Shortness of breath. Lethargy. EXAM: PORTABLE CHEST 1 VIEW COMPARISON:  Radiograph 03/12/2018. Chest CT 02/19/2018 FINDINGS: The patient is rotated. Cardiomegaly is unchanged from prior exam. Right perihilar opacities likely enlarged pulmonary arteries as seen on CT. Improved right basilar aeration from prior exam. Persistent blunting of the left costophrenic angle. Decreased vascular congestion. No pneumothorax. The bones are under mineralized. IMPRESSION: 1. Stable cardiomegaly. Right hilar prominence is likely enlarged pulmonary artery accentuated by rotation. Decreased vascular congestion since  last month. 2. Improved right basilar aeration from prior exam. Persistent blunting of left costophrenic angle, improved from prior and likely a small amount of pleural fluid. Electronically Signed   By: Narda RutherfordMelanie  Sanford M.D.   On: 04/10/2018 22:36    EKG: Independently reviewed.  It shows atrial fibrillation with a rate of 65.  No significant changes.  Assessment/Plan Principal Problem:   Symptomatic anemia Active Problems:   Hyponatremia   Hyperkalemia   Chronic a-fib   HTN (hypertension)     #1 symptomatic anemia: Secondary to ongoing GI bleed.  Patient will be admitted.  IV Protonix started.  IV fluids.  Serial CBCs every 6 hours.  Type and crossmatch and transfuse 2 units of packed red blood cells.  GI consulted.  With elevation of BUN probably upper GI bleed.  Hold her Eliquis.  #2 hyponatremia: Probably dehydration.  Initiate saline.  No diuretics.  #3 hyperkalemia: Borderline.  Avoid ACE inhibitors and other potassium sparing medications.  #4 chronic A. fib: Rate is controlled.  Hold Eliquis.  #5 hypertension: Continue blood pressure control.  At this point it appears controlled.   DVT prophylaxis: SCD Code Status: Full code Family Communication: No family at bedside Disposition Plan: Back to skilled facility Consults called: Dr. Matthias Hughs of gastroenterology Admission status: Inpatient  Severity of Illness: The appropriate patient status for this patient is INPATIENT. Inpatient status is judged to be reasonable and necessary in order to provide the required intensity of service to ensure the patient's safety. The patient's presenting symptoms, physical exam findings, and initial radiographic and laboratory data in the context of their chronic comorbidities is felt to place them at high risk for further clinical deterioration. Furthermore, it is not anticipated that the patient will be medically stable for discharge from the hospital within 2 midnights of admission. The  following factors support the patient status of inpatient.   " The patient's presenting symptoms include exertional dyspnea. " The worrisome physical exam findings include mild pallor. " The initial radiographic and laboratory data are worrisome because of hemoglobin of 6.1. " The chronic co-morbidities include atrial fibrillation on Eliquis.   * I certify that at the point of admission it is my clinical judgment that the patient will require inpatient hospital care spanning beyond 2 midnights from the point of admission due to high intensity of service, high risk for further deterioration and high frequency of surveillance required.Lonia Blood MD [facility] Pager [phone]  If 7PM-7AM, please contact night-coverage [email or web address] Password [email or web address]  04/10/2018, 11:17 PM

## 2018-04-10 NOTE — ED Notes (Signed)
Spoke with daughter Aluna who called to check in regarding her mother.

## 2018-04-10 NOTE — ED Provider Notes (Signed)
[facility]-EMERGENCY DEPT Provider Note   CSN: [id number] Arrival date & time: 04/10/18  2135    History   Chief Complaint Chief Complaint  Patient presents with   Abnormal Lab   Weakness   Fatigue    HPI [name] is a 85 y.o. female hx of HL, HTN, afib on eliquis, here presenting with weakness, hypoxia, altered mental status.  Patient is from [facility] nursing home.  Patient was noted to be altered and confused today.  She also had worsening shortness of breath and is on baseline 2 L nasal cannula and had to increase her oxygen to 4 L.  Patient also was noted to be anemic with hemoglobin 7.3 and also hypotensive to the 90s.  Patient was started on D5W drip despite normal blood sugars and blood pressure improved and patient was sent for evaluation.     The history is provided by the patient.    Past Medical History:  Diagnosis Date   Arthritis    HIPS   Full dentures    Hyperlipidemia    Hypertension    Osteoporosis    Rectovaginal fistula    Renal cyst     Patient Active Problem List   Diagnosis Date Noted   Symptomatic anemia 04/10/2018   Rectovaginal fistula 07/04/2013    Past Surgical History:  Procedure Laterality Date   CATARACT EXTRACTION W/ INTRAOCULAR LENS  IMPLANT, BILATERAL  2015   CHOLECYSTECTOMY  2007   COLONOSCOPY  2010   EXCISION BENIGN LEFT BREAST CYST  YRS AGO     OB History   No obstetric history on file.      Home Medications    Prior to Admission medications   Medication Sig Start Date End Date Taking? Authorizing Provider  amLODipine (NORVASC) 10 MG tablet Take 10 mg by mouth daily.    [provider]  atorvastatin (LIPITOR) 10 MG tablet Take 10 mg by mouth every evening.     [provider]  carvedilol (COREG) 6.25 MG tablet Take 6.25 mg by mouth 2 (two) times daily with a meal. PT CURRENTLY IS TAKING 3.25 BID    [provider]  ciprofloxacin (CIPRO) 500 MG tablet  Take 1 tablet (500 mg total) by mouth 2 (two) times daily. 07/20/13   Romie Levee, MD  cloNIDine (CATAPRES) 0.2 MG tablet Take 0.2 mg by mouth 2 (two) times daily.    [provider]  gabapentin (NEURONTIN) 300 MG capsule Take 300 mg by mouth 3 (three) times daily.    [provider]  HYDROcodone-acetaminophen (NORCO/VICODIN) 5-325 MG per tablet Take 1 tablet by mouth every 6 (six) hours as needed. 07/20/13   Romie Levee, MD  labetalol (NORMODYNE) 100 MG tablet Take 100 mg by mouth 2 (two) times daily.    [provider]  losartan (COZAAR) 100 MG tablet Take 100 mg by mouth daily.    [provider]  metroNIDAZOLE (FLAGYL) 500 MG tablet Take 1 tablet (500 mg total) by mouth 3 (three) times daily. 07/20/13   Romie Levee, MD  nystatin (MYCOSTATIN) 100000 UNIT/ML suspension Take 5 mLs (500,000 Units total) by mouth 4 (four) times daily. 07/24/13   Gaynelle Adu, MD  Omega-3 Fatty Acids (FISH OIL) 1000 MG CAPS Take 1 capsule by mouth daily.    [provider]  polyethylene glycol (MIRALAX / GLYCOLAX) packet Take 17 g by mouth daily.    [provider]  spironolactone (ALDACTONE) 25 MG tablet Take 25 mg  by mouth daily. LAST DOSE TAKEN OVER 4 MONTHS AGO    [provider]  Wheat Dextrin (BENEFIBER DRINK MIX PO) Take by mouth daily.    [provider]  zolpidem (AMBIEN) 10 MG tablet Take 10 mg by mouth at bedtime.     [provider]    Family History No family history on file.  Social History Social History   Tobacco Use   Smoking status: Current Every Day Smoker    Packs/day: 0.30    Years: 10.00    Pack years: 3.00    Types: Cigarettes   Smokeless tobacco: Never Used  Substance Use Topics   Alcohol use: No   Drug use: No     Allergies   Oxycodone   Review of Systems Review of Systems  Neurological: Positive for weakness.  All other systems reviewed and are negative.    Physical  Exam Updated Vital Signs BP (!) 81/57    Pulse 61    Temp 97.6 F (36.4 C) (Oral)    Resp (!) 25    Ht 5\' 1"  (1.549 m)    Wt 47.2 kg    SpO2 98%    BMI 19.65 kg/m   Physical Exam Vitals signs and nursing note reviewed.  HENT:     Head: Normocephalic.     Mouth/Throat:     Mouth: Mucous membranes are moist.  Eyes:     Extraocular Movements: Extraocular movements intact.     Pupils: Pupils are equal, round, and reactive to light.  Neck:     Musculoskeletal: Normal range of motion.  Cardiovascular:     Rate and Rhythm: Normal rate and regular rhythm.  Pulmonary:     Comments: Slightly tachypneic, diminished bilateral bases, no wheezing  Abdominal:     General: Abdomen is flat.     Palpations: Abdomen is soft.  Genitourinary:    Comments: Rectal- dark stool  Musculoskeletal: Normal range of motion.  Skin:    General: Skin is warm.     Capillary Refill: Capillary refill takes less than 2 seconds.  Neurological:     General: No focal deficit present.     Mental Status: She is alert.     Comments: A & O x 3. nonfocal neuro exam   Psychiatric:        Mood and Affect: Mood normal.      ED Treatments / Results  Labs (all labs ordered are listed, but only abnormal results are displayed) Labs Reviewed  CBC WITH DIFFERENTIAL/PLATELET - Abnormal; Notable for the following components:      Result Value   RBC 2.38 (*)    Hemoglobin 6.8 (*)    HCT 22.2 (*)    RDW 17.6 (*)    Platelets 53 (*)    nRBC 1.3 (*)    Lymphs Abs 0.4 (*)    Abs Immature Granulocytes 0.23 (*)    All other components within normal limits  COMPREHENSIVE METABOLIC PANEL - Abnormal; Notable for the following components:   Sodium 131 (*)    Potassium 5.4 (*)    Chloride 93 (*)    Glucose, Bld 123 (*)    BUN 60 (*)    Calcium 8.4 (*)    Total Protein 5.0 (*)    Albumin 2.1 (*)    AST 136 (*)    ALT 126 (*)    Alkaline Phosphatase 170 (*)    All other components within normal limits  POCT I-STAT EG7  -  Abnormal; Notable for the following components:   pH, Ven 7.492 (*)    Bicarbonate 33.9 (*)    TCO2 35 (*)    Acid-Base Excess 10.0 (*)    Sodium 129 (*)    Potassium 5.6 (*)    HCT 18.0 (*)    Hemoglobin 6.1 (*)    All other components within normal limits  URINE CULTURE  PROTIME-INR  BRAIN NATRIURETIC PEPTIDE  URINALYSIS, ROUTINE W REFLEX MICROSCOPIC  I-STAT TROPONIN, ED  POC OCCULT BLOOD, ED  TYPE AND SCREEN  PREPARE RBC (CROSSMATCH)    EKG EKG Interpretation  Date/Time:  Monday April 10 2018 21:54:00 EDT Ventricular Rate:  65 PR Interval:    QRS Duration: 92 QT Interval:  411 QTC Calculation: 428 R Axis:   41 Text Interpretation:  Atrial fibrillation Left ventricular hypertrophy No significant change since last tracing Confirmed by Richardean CanalYao, Adeleine Pask H [phone]([id number]) on 04/10/2018 10:04:48 PM   Radiology Ct Head Wo Contrast  Result Date: 04/10/2018 CLINICAL DATA:  Altered level of consciousness. Weakness and lethargy. EXAM: CT HEAD WITHOUT CONTRAST TECHNIQUE: Contiguous axial images were obtained from the base of the skull through the vertex without intravenous contrast. COMPARISON:  Head CT 11/09/2017 FINDINGS: Brain: No intracranial hemorrhage, mass effect, or midline shift. Brain volume is normal for age. No hydrocephalus. The basilar cisterns are patent. No evidence of territorial infarct or acute ischemia. No extra-axial or intracranial fluid collection. Vascular: Atherosclerosis of skullbase vasculature without hyperdense vessel or abnormal calcification. Skull: No fracture or focal lesion. Sinuses/Orbits: No acute findings. Bilateral cataract resection. Other: Multiple small metallic densities in the left temporal scalp in just lateral to the superior left orbit are new from prior exam, no associated air. IMPRESSION: 1. No acute intracranial abnormality. 2. Punctate metallic densities in the left frontal scalp soft tissue are new from November 2019 but do not appear acute.  Electronically Signed   By: Narda RutherfordMelanie  Sanford M.D.   On: 04/10/2018 22:46   Dg Chest Port 1 View  Result Date: 04/10/2018 CLINICAL DATA:  Shortness of breath. Lethargy. EXAM: PORTABLE CHEST 1 VIEW COMPARISON:  Radiograph 03/12/2018. Chest CT 02/19/2018 FINDINGS: The patient is rotated. Cardiomegaly is unchanged from prior exam. Right perihilar opacities likely enlarged pulmonary arteries as seen on CT. Improved right basilar aeration from prior exam. Persistent blunting of the left costophrenic angle. Decreased vascular congestion. No pneumothorax. The bones are under mineralized. IMPRESSION: 1. Stable cardiomegaly. Right hilar prominence is likely enlarged pulmonary artery accentuated by rotation. Decreased vascular congestion since last month. 2. Improved right basilar aeration from prior exam. Persistent blunting of left costophrenic angle, improved from prior and likely a small amount of pleural fluid. Electronically Signed   By: Narda RutherfordMelanie  Sanford M.D.   On: 04/10/2018 22:36    Procedures Procedures (including critical care time)  CRITICAL CARE Performed by: Richardean Canalavid H Trigg Delarocha   Total critical care time: 30 minutes  Critical care time was exclusive of separately billable procedures and treating other patients.  Critical care was necessary to treat or prevent imminent or life-threatening deterioration.  Critical care was time spent personally by me on the following activities: development of treatment plan with patient and/or surrogate as well as nursing, discussions with consultants, evaluation of patient's response to treatment, examination of patient, obtaining history from patient or surrogate, ordering and performing treatments and interventions, ordering and review of laboratory studies, ordering and review of radiographic studies, pulse oximetry and re-evaluation of patient's condition.   Medications Ordered in ED Medications  0.9 %  sodium chloride infusion (has no administration in time  range)  sodium chloride 0.9 % bolus 1,000 mL (has no administration in time range)  pantoprazole (PROTONIX) 80 mg in sodium chloride 0.9 % 100 mL IVPB (has no administration in time range)  pantoprazole (PROTONIX) 80 mg in sodium chloride 0.9 % 250 mL (0.32 mg/mL) infusion (has no administration in time range)     Initial Impression / Assessment and Plan / ED Course  I have reviewed the triage vital signs and the nursing notes.  Pertinent labs & imaging results that were available during my care of the patient were reviewed by me and considered in my medical decision making (see chart for details).       AMBIKA ZETTLEMOYER is a 85 y.o. female here with SOB, anemia. Patient's baseline Hg is about 10 (a month ago). Patient is on eliquis and likely has slow GI bleed and now has symptomatic anemia.    10:51 PM Patient's Hg is 6.8. Also BUN is 60. Cr is normal. Guiac is positive. Ordered 1 U PRBC and started protonix IV bolus and drip. I talked to Dr. Matthias Hughs from GI and he will see patient as consult. Hospitalist to admit for melena, symptomatic anemia.   Final Clinical Impressions(s) / ED Diagnoses   Final diagnoses:  None    ED Discharge Orders    None       Charlynne Pander, MD 04/10/18 2253

## 2018-04-10 NOTE — ED Triage Notes (Signed)
[facility] transported pt to [facility] ED from [facility] and reports the following:  -- They called EMS because of her abnormal lab work, weakness, and lethargy. Low Hgb and Hct.  --Typically, on 2L oxygen, [facility] increased it to 4L due to low O2. Today, [facility] gave D5W for lethargy.   --Hx: DM2, asthma, afib, CHF, HTN, COPD, lupus  Vitals: BP 111/57 HR 60-80, Hx afib RR 20 CBG 125 AOx4  18 left ac

## 2018-04-10 NOTE — ED Notes (Signed)
Date and time results received: 04/10/18 10:31 PM  (use smartphrase ".now" to insert current time)  Test: Hemoglobin Critical Value: 6.8  Name of Provider Notified: Dr.Yao  Orders Received? Or Actions Taken?:none

## 2018-04-11 ENCOUNTER — Encounter (HOSPITAL_COMMUNITY): Payer: Self-pay | Admitting: Gastroenterology

## 2018-04-11 LAB — CBC WITH DIFFERENTIAL/PLATELET
Abs Immature Granulocytes: 0.23 10*3/uL — ABNORMAL HIGH (ref 0.00–0.07)
Basophils Absolute: 0 10*3/uL (ref 0.0–0.1)
Basophils Relative: 0 %
Eosinophils Absolute: 0 10*3/uL (ref 0.0–0.5)
Eosinophils Relative: 0 %
HCT: 24.4 % — ABNORMAL LOW (ref 36.0–46.0)
Hemoglobin: 7.5 g/dL — ABNORMAL LOW (ref 12.0–15.0)
Immature Granulocytes: 4 %
Lymphocytes Relative: 9 %
Lymphs Abs: 0.6 10*3/uL — ABNORMAL LOW (ref 0.7–4.0)
MCH: 28.5 pg (ref 26.0–34.0)
MCHC: 30.7 g/dL (ref 30.0–36.0)
MCV: 92.8 fL (ref 80.0–100.0)
Monocytes Absolute: 0.6 10*3/uL (ref 0.1–1.0)
Monocytes Relative: 10 %
Neutro Abs: 4.7 10*3/uL (ref 1.7–7.7)
Neutrophils Relative %: 77 %
Platelets: 46 10*3/uL — ABNORMAL LOW (ref 150–400)
RBC: 2.63 MIL/uL — ABNORMAL LOW (ref 3.87–5.11)
RDW: 17.7 % — ABNORMAL HIGH (ref 11.5–15.5)
WBC: 6.1 10*3/uL (ref 4.0–10.5)
nRBC: 1 % — ABNORMAL HIGH (ref 0.0–0.2)

## 2018-04-11 LAB — OCCULT BLOOD, POC DEVICE: Fecal Occult Bld: POSITIVE — AB

## 2018-04-11 LAB — CBC
HCT: 19.8 % — ABNORMAL LOW (ref 36.0–46.0)
Hemoglobin: 6 g/dL — CL (ref 12.0–15.0)
MCH: 28.6 pg (ref 26.0–34.0)
MCHC: 30.3 g/dL (ref 30.0–36.0)
MCV: 94.3 fL (ref 80.0–100.0)
Platelets: 53 10*3/uL — ABNORMAL LOW (ref 150–400)
RBC: 2.1 MIL/uL — ABNORMAL LOW (ref 3.87–5.11)
RDW: 17.5 % — ABNORMAL HIGH (ref 11.5–15.5)
WBC: 5.8 10*3/uL (ref 4.0–10.5)
nRBC: 1.4 % — ABNORMAL HIGH (ref 0.0–0.2)

## 2018-04-11 LAB — GLUCOSE, CAPILLARY
Glucose-Capillary: 178 mg/dL — ABNORMAL HIGH (ref 70–99)
Glucose-Capillary: 215 mg/dL — ABNORMAL HIGH (ref 70–99)

## 2018-04-11 LAB — ABO/RH: ABO/RH(D): O POS

## 2018-04-11 LAB — HEMOGLOBIN A1C
Hgb A1c MFr Bld: 6.6 % — ABNORMAL HIGH (ref 4.8–5.6)
Mean Plasma Glucose: 142.72 mg/dL

## 2018-04-11 MED ORDER — ZOLPIDEM TARTRATE 5 MG PO TABS
5.0000 mg | ORAL_TABLET | Freq: Every day | ORAL | Status: DC
  Administered 2018-04-11: 5 mg via ORAL
  Filled 2018-04-11: qty 1

## 2018-04-11 MED ORDER — INSULIN ASPART 100 UNIT/ML ~~LOC~~ SOLN
0.0000 [IU] | Freq: Three times a day (TID) | SUBCUTANEOUS | Status: DC
  Administered 2018-04-11: 2 [IU] via SUBCUTANEOUS
  Administered 2018-04-12 – 2018-04-13 (×2): 1 [IU] via SUBCUTANEOUS
  Administered 2018-04-13: 2 [IU] via SUBCUTANEOUS

## 2018-04-11 MED ORDER — FERROUS SULFATE 325 (65 FE) MG PO TABS
325.0000 mg | ORAL_TABLET | ORAL | Status: DC
  Administered 2018-04-11: 325 mg via ORAL
  Filled 2018-04-11: qty 1

## 2018-04-11 MED ORDER — SODIUM CHLORIDE 0.9 % IV BOLUS
500.0000 mL | Freq: Once | INTRAVENOUS | Status: AC
  Administered 2018-04-11: 500 mL via INTRAVENOUS

## 2018-04-11 MED ORDER — ONDANSETRON HCL 4 MG/2ML IJ SOLN: 4.0000 mg | Freq: Four times a day (QID) | INTRAMUSCULAR | Status: DC | PRN

## 2018-04-11 MED ORDER — ESCITALOPRAM OXALATE 10 MG PO TABS
5.0000 mg | ORAL_TABLET | Freq: Every day | ORAL | Status: DC
  Administered 2018-04-11: 14:00:00 5 mg via ORAL
  Filled 2018-04-11: qty 1

## 2018-04-11 MED ORDER — DICYCLOMINE HCL 20 MG PO TABS
20.0000 mg | ORAL_TABLET | Freq: Once | ORAL | Status: AC
  Administered 2018-04-11: 20 mg via ORAL
  Filled 2018-04-11: qty 1

## 2018-04-11 MED ORDER — SENNA 8.6 MG PO TABS
1.0000 | ORAL_TABLET | Freq: Every day | ORAL | Status: DC
  Administered 2018-04-11: 21:00:00 8.6 mg via ORAL
  Filled 2018-04-11: qty 1

## 2018-04-11 MED ORDER — TORSEMIDE 20 MG PO TABS
40.0000 mg | ORAL_TABLET | Freq: Two times a day (BID) | ORAL | Status: DC
  Filled 2018-04-11: qty 2

## 2018-04-11 MED ORDER — CARVEDILOL 25 MG PO TABS
25.0000 mg | ORAL_TABLET | Freq: Two times a day (BID) | ORAL | Status: DC
  Administered 2018-04-11: 17:00:00 25 mg via ORAL
  Filled 2018-04-11: qty 1

## 2018-04-11 MED ORDER — PREDNISONE 20 MG PO TABS
20.0000 mg | ORAL_TABLET | Freq: Two times a day (BID) | ORAL | Status: DC
  Administered 2018-04-11: 17:00:00 20 mg via ORAL
  Filled 2018-04-11: qty 1

## 2018-04-11 MED ORDER — DILTIAZEM HCL ER COATED BEADS 180 MG PO CP24
180.0000 mg | ORAL_CAPSULE | Freq: Every day | ORAL | Status: DC
  Administered 2018-04-11: 180 mg via ORAL
  Filled 2018-04-11: qty 1

## 2018-04-11 MED ORDER — ATORVASTATIN CALCIUM 10 MG PO TABS
10.0000 mg | ORAL_TABLET | Freq: Every evening | ORAL | Status: DC
  Administered 2018-04-11: 10 mg via ORAL
  Filled 2018-04-11: qty 1

## 2018-04-11 MED ORDER — TRAMADOL HCL 50 MG PO TABS: 50.0000 mg | ORAL_TABLET | Freq: Four times a day (QID) | ORAL | Status: DC | PRN

## 2018-04-11 MED ORDER — ACETAMINOPHEN 325 MG PO TABS
650.0000 mg | ORAL_TABLET | Freq: Four times a day (QID) | ORAL | Status: DC | PRN
  Administered 2018-04-11 (×2): 650 mg via ORAL
  Filled 2018-04-11 (×2): qty 2

## 2018-04-11 MED ORDER — GABAPENTIN 300 MG PO CAPS
300.0000 mg | ORAL_CAPSULE | Freq: Two times a day (BID) | ORAL | Status: DC
  Administered 2018-04-11 (×2): 300 mg via ORAL
  Filled 2018-04-11 (×2): qty 1

## 2018-04-11 MED ORDER — INSULIN ASPART 100 UNIT/ML ~~LOC~~ SOLN
0.0000 [IU] | Freq: Every day | SUBCUTANEOUS | Status: DC
  Administered 2018-04-11: 2 [IU] via SUBCUTANEOUS

## 2018-04-11 MED ORDER — VALACYCLOVIR HCL 500 MG PO TABS
500.0000 mg | ORAL_TABLET | Freq: Two times a day (BID) | ORAL | Status: DC
  Administered 2018-04-11 (×2): 500 mg via ORAL
  Filled 2018-04-11 (×2): qty 1

## 2018-04-11 MED ORDER — CALCITONIN (SALMON) 200 UNIT/ACT NA SOLN
1.0000 | Freq: Every day | NASAL | Status: DC
  Administered 2018-04-11 – 2018-04-13 (×2): 1 via NASAL
  Filled 2018-04-11: qty 3.7

## 2018-04-11 MED ORDER — ONDANSETRON HCL 4 MG PO TABS: 4.0000 mg | ORAL_TABLET | Freq: Four times a day (QID) | ORAL | Status: DC | PRN

## 2018-04-11 MED ORDER — SODIUM CHLORIDE 0.9 % IV SOLN
INTRAVENOUS | Status: DC
  Administered 2018-04-11 (×2): via INTRAVENOUS

## 2018-04-11 NOTE — Progress Notes (Signed)
PROGRESS NOTE  [name] KKX:[id number] DOB: 24-Apr-1932 DOA: 04/10/2018 PCP: Gordan Payment., MD   LOS: 1 day   Brief narrative: Patient is an 85 y.o. female with PMH significant of A. fib on Eliquis, hypertension, hyperlipidemia, diabetes mellitus, coronary artery disease, chronic diastolic congestive heart failure, ascending aortic aneurysm, chronic anemia, history of temporal arteritis, osteoporosis, history of rectovaginal fistula and osteoarthritis.  She was sent from SNF on 4/6 with generalized weakness, fatigue, increased oxygen requirement, hypotension and hemoglobin low at 7.3.   In the ER, patient was found to have melena, guaiac positive stool.  Hemoglobin was initially 6.8 and on recheck dropped to 6.1.   She was transfused 1 unit of blood overnight and admitted for evaluation of GI bleeding.    Subjective: Patient was seen and examined this morning.  Elderly demented female.  Not in distress.  Unable to clarify if she had any previous history of bleeding or endoscopy/colonoscopy.  Assessment/Plan:  Principal Problem:   Symptomatic anemia Active Problems:   Hyponatremia   Hyperkalemia   Chronic a-fib   HTN (hypertension)  Acute blood loss anemia/GI bleeding - Presented with melena, guaiac positive stool in the setting of Eliquis intake. - Hemoglobin low at 6.  1 unit of PRBC transfused last night.  Repeat hemoglobin this morning 7.5. - Eliquis on hold - GI consult appreciated.  To continue IV Protonix drip.  Patient may undergo endoscopy/colonoscopy after Eliquis washout. Clear liquid diet started for now. - Continue iron supplementation  Cardiovascular issues : Hypertension/ hyperlipidemia/ chronic A. Fib/ chronic diastolic heart failure/ coronary artery disease/ascending aortic aneurysm - Blood pressure was low in 80s on admission.  Improved to 100s this morning.   - Resume Cardizem, Coreg.  Hold Eliquis. - Consider resuming torsemide once blood pressure improves.   Renal function normal. - Continue statin  Type 2 diabetes mellitus - Home list mentions Lantus 15 units nightly as well as sliding scale insulin.  Blood sugar was 123 on initial blood work.  Monitor CBG.  Restart Lantus as needed.   - Obtain hemoglobin A1c. - Glimepiride on hold - Continue gabapentin  Hyponatremia:   - Sodium level acutely low at 129 at presentation.   - Prior to admission, patient was on torsemide 40 mg twice daily.  On admission, torsemide was held, patient was started on normal saline. I'm afraid that the patient is at risk of fluid overload.  We will stop normal saline.  Consider resuming torsemide once blood pressure improves.    Hyperkalemia:  - Potassium level was elevated to 5.4.  Repeat potassium level this morning is 5.6.  - Torsemide remains on hold.  Hold potassium supplement.  History of temporal arteritis -Noted 1 of the previous documentations.  Patient is on prednisone 20 mg 2 times daily.  Not sure if this is for temporal arteritis or COPD. Resume the same.   History of insomnia - Continue Ambien  Dementia  - Continue supportive care.  Patient does not seem to be oriented to time.  Unable to clarify details of her baseline mobility and mental status.  Body mass index is 19.65 kg/m. Diet: Clear liquid diet DVT prophylaxis:  SCDs Code Status:   Code Status: Full Code  Family Communication:  Family not at bedside Disposition Plan:  Back to SNF after GI intervention  Consultants:  GI  Procedures:  Not yet  Antimicrobials:  Anti-infectives (From admission, onward)   None      Infusions:  . sodium chloride  100 mL/hr at 04/11/18 1038  . pantoprozole (PROTONIX) infusion Stopped (04/11/18 0234)    Scheduled Meds:   PRN meds: acetaminophen, ondansetron **OR** ondansetron (ZOFRAN) IV   Objective: Vitals:   04/11/18 0250 04/11/18 0528  BP: (!) 106/48 (!) 107/47  Pulse: (!) 52 (!) 51  Resp: 18 18  Temp: 97.6 F (36.4 C) 97.7 F  (36.5 C)  SpO2: 93% 95%    Intake/Output Summary (Last 24 hours) at 04/11/2018 1208 Last data filed at 04/11/2018 0716 Gross per 24 hour  Intake 415 ml  Output 400 ml  Net 15 ml   Filed Weights   04/10/18 2137  Weight: 47.2 kg   Weight change:  Body mass index is 19.65 kg/m.   Physical Exam: General exam: Appears calm and comfortable.  Elderly demented female Skin: No rashes, lesions or ulcers. HEENT: Normal Lungs: Clear to auscultation bilaterally CVS: Regular rate and rhythm, no murmur GI/Abd soft, nondistended, nontender, bowel sound present CNS: Alert, awake, oriented to place and person, not to time Psychiatry: Mood & affect appropriate.  Extremities: No pedal edema, no calf tenderness  Data Review: I have personally reviewed the laboratory data and studies available.  Recent Labs  Lab 04/10/18 2207 04/10/18 2220 04/11/18 0101 04/11/18 0803  WBC 6.9  --  5.8 6.1  NEUTROABS 5.6  --   --  4.7  HGB 6.8* 6.1* 6.0* 7.5*  HCT 22.2* 18.0* 19.8* 24.4*  MCV 93.3  --  94.3 92.8  PLT 53*  --  53* 46*   Recent Labs  Lab 04/10/18 2207 04/10/18 2220  NA 131* 129*  K 5.4* 5.6*  CL 93*  --   CO2 30  --   GLUCOSE 123*  --   BUN 60*  --   CREATININE 0.87  --   CALCIUM 8.4*  --     Lorin GlassBinaya Audra Kagel, MD  [facility] 04/11/2018

## 2018-04-11 NOTE — TOC Initial Note (Signed)
Transition of Care [facility]) - Initial/Assessment Note    Patient Details  Name: [name] MRN: [id number] Date of Birth: 23-Aug-1932  [facility]) CM/SW Contact:    Lanier Clam, RN Phone Number: 04/11/2018, 11:36 AM  Clinical Narrative: GIB.TC [facility] rep Angela-confirmed from [facility] plan to return @ d/c.GI following-may need scope,but not until eliquis is out of system. fl2 faxed.                 Expected Discharge Plan: Skilled Nursing Facility Barriers to Discharge: Continued Medical Work up   Patient Goals and CMS Choice Patient states their goals for this hospitalization and ongoing recovery are:: (get well)      Expected Discharge Plan and Services Expected Discharge Plan: Skilled Nursing Facility   Discharge Planning Services: CM Consult Post Acute Care Choice: Skilled Nursing Facility Living arrangements for the past 2 months: Skilled Nursing Facility                          Prior Living Arrangements/Services Living arrangements for the past 2 months: Skilled Nursing Facility Lives with:: Other (Comment)(SNF) Patient language and need for interpreter reviewed:: Yes Do you feel safe going back to the place where you live?: Yes      Need for Family Participation in Patient Care: No (Comment) Care giver support system in place?: Yes (comment)   Criminal Activity/Legal Involvement Pertinent to Current Situation/Hospitalization: No - Comment as needed  Activities of Daily Living Home Assistive Devices/Equipment: None ADL Screening (condition at time of admission) Patient's cognitive ability adequate to safely complete daily activities?: No Is the patient deaf or have difficulty hearing?: No Does the patient have difficulty seeing, even when wearing glasses/contacts?: No Does the patient have difficulty concentrating, remembering, or making decisions?: Yes Patient able to express need for assistance with ADLs?: Yes Does the patient have  difficulty dressing or bathing?: Yes Independently performs ADLs?: No Communication: Independent Dressing (OT): Needs assistance Grooming: Needs assistance Feeding: Needs assistance Bathing: Dependent Toileting: Dependent In/Out Bed: Dependent Does the patient have difficulty walking or climbing stairs?: Yes Weakness of Legs: Both Weakness of Arms/Hands: Both  Permission Sought/Granted Permission sought to share information with : Case Manager Permission granted to share information with : Yes, Verbal Permission Granted  Share Information with NAME: (S) Maureen Chatters  Permission granted to share info w AGENCY: ([facility])  Permission granted to share info w Relationship: (dtr)  Permission granted to share info w Contact Information: [phone])  Emotional Assessment Appearance:: Appears stated age, Well-Groomed Attitude/Demeanor/Rapport: Gracious Affect (typically observed): Accepting Orientation: : Oriented to Self, Oriented to Place Alcohol / Substance Use: Never Used Psych Involvement: No (comment)  Admission diagnosis:  Symptomatic anemia [D64.9] Gastrointestinal hemorrhage, unspecified gastrointestinal hemorrhage type [K92.2] Patient Active Problem List   Diagnosis Date Noted  . Symptomatic anemia 04/10/2018  . Hyponatremia 04/10/2018  . Hyperkalemia 04/10/2018  . Chronic a-fib 04/10/2018  . HTN (hypertension) 04/10/2018  . Rectovaginal fistula 07/04/2013   PCP:  Gordan Payment., MD Pharmacy:   [facility], [place] - [address] [place] [address] Phone: [phone] Fax: [phone]     Social Determinants of Health (SDOH) Interventions    Readmission Risk Interventions No flowsheet data found.

## 2018-04-11 NOTE — Progress Notes (Signed)
Pt's HR noted to be in 30s (rhythm Afib) tonight, lowest it went to was 27. Pt c/o stomach and back pain, tylenol ineffective. Paged Merdis Delay, NP to make aware. Bentyl ordered. Spoke w/ pt's daughter [name] earlier this shift to update. Will continue to monitor.

## 2018-04-11 NOTE — NC FL2 (Signed)
  [place] MEDICAID FL2 LEVEL OF CARE SCREENING TOOL     IDENTIFICATION  Patient Name: [name] Birthdate: 01-Apr-1932 Sex: female Admission Date (Current Location): 04/10/2018  [facility] and [place] Number:  Producer, television/film/video and Address:  [facility],  [address] [place] [address]      Provider Number: [id number]  Attending Physician Name and Address:  Lorin Glass, MD  Relative Name and Phone Number:  Maureen Chatters [phone])    Current Level of Care: Hospital Recommended Level of Care: Skilled Nursing Facility Prior Approval Number:    Date Approved/Denied:   PASRR Number:    Discharge Plan: SNF    Current Diagnoses: Patient Active Problem List   Diagnosis Date Noted  . Symptomatic anemia 04/10/2018  . Hyponatremia 04/10/2018  . Hyperkalemia 04/10/2018  . Chronic a-fib 04/10/2018  . HTN (hypertension) 04/10/2018  . Rectovaginal fistula 07/04/2013    Orientation RESPIRATION BLADDER Height & Weight     Self, Place  Normal External catheter Weight: 47.2 kg Height:  5\' 1"  (154.9 cm)  BEHAVIORAL SYMPTOMS/MOOD NEUROLOGICAL BOWEL NUTRITION STATUS      Incontinent Diet(Reg)  AMBULATORY STATUS COMMUNICATION OF NEEDS Skin   Limited Assist Verbally Bruising, Other (Comment)(stage 2 sacral wound;gen'l bruising;fever blister lower lip)                       Personal Care Assistance Level of Assistance  Bathing, Feeding, Dressing Bathing Assistance: Limited assistance Feeding assistance: Limited assistance Dressing Assistance: Limited assistance     Functional Limitations Info  Speech(dentures-uppers/lowers)          SPECIAL CARE FACTORS FREQUENCY  PT (By licensed PT), OT (By licensed OT)     PT Frequency: 5x week OT Frequency: 5x week            Contractures Contractures Info: Not present    Additional Factors Info  Allergies Code Status Info: Full code Allergies Info:  Fosamax Alendronate Sodium,  Hydrocodone-homatropine, Losartan Potassium, Oxycodone           Current Medications (04/11/2018):  This is the current hospital active medication list Current Facility-Administered Medications  Medication Dose Route Frequency Provider Last Rate Last Dose  . 0.9 %  sodium chloride infusion   Intravenous Continuous Earlie Lou L, MD 100 mL/hr at 04/11/18 1038    . acetaminophen (TYLENOL) tablet 650 mg  650 mg Oral Q6H PRN Lorin Glass, MD   650 mg at 04/11/18 1039  . ondansetron (ZOFRAN) tablet 4 mg  4 mg Oral Q6H PRN Rometta Emery, MD       Or  . ondansetron (ZOFRAN) injection 4 mg  4 mg Intravenous Q6H PRN Earlie Lou L, MD      . pantoprazole (PROTONIX) 80 mg in sodium chloride 0.9 % 250 mL (0.32 mg/mL) infusion  8 mg/hr Intravenous Continuous Rometta Emery, MD   Stopped at 04/11/18 0234     Discharge Medications: Please see discharge summary for a list of discharge medications.  Relevant Imaging Results:  Relevant Lab Results:   Additional Information [phone], Olegario Messier, [place]

## 2018-04-11 NOTE — Consult Note (Signed)
[facility] CONSULT Reason for consult: GI bleed Referring Physician: [facility].  Patient unassigned is resident of [facility] and has multiple problems including hypertension osteoporosis history of surgery for rectovaginal fistula.  She has A. fib and has been on Eliquis at the nursing home.  She had generalized weakness and low hemoglobin was hypotensive and hypoxic requiring oxygen.  She had guaiac positive stool and I believe guaiac positive vomitus.  She has A. fib and according to the nursing home records has been taking Eliquis.She presented with a hemoglobin of 6.8 is back up to 7.5 with transfusion.  Her BUN was 60 and creatinine was normal at 0.87.  The patient is not sure if she is ever had colonoscopy.  She has never had EGD and no history of ulcers.  It does not appear from reviewing the notes at the nursing home that she has been on any NSAIDs.  We are asked to see her for GI bleeding.  She is on IV Protonix and is n.p.o.  [name] is an 85 y.o. female.  HPI: She is a resident of [facility]  Past Medical History:  Diagnosis Date  . A-fib (HCC)   . Arthritis    HIPS  . Full dentures   . Hyperlipidemia   . Hypertension   . Osteoporosis   . Rectovaginal fistula   . Renal cyst     Past Surgical History:  Procedure Laterality Date  . CATARACT EXTRACTION W/ INTRAOCULAR LENS  IMPLANT, BILATERAL  2015  . CHOLECYSTECTOMY  2007  . COLONOSCOPY  2010  . EXCISION BENIGN LEFT BREAST CYST  YRS AGO    No family history on file.  Social History:  reports that she has been smoking cigarettes. She has a 3.00 pack-year smoking history. She has never used smokeless tobacco. She reports that she does not drink alcohol or use drugs.  Allergies:  Allergies  Allergen Reactions  . Fosamax [Alendronate Sodium] Other (See Comments)    Gi upset  . Hydrocodone-Homatropine Other (See Comments)    Unknown  . Losartan Potassium Other (See Comments)     Unknown  . Oxycodone Other (See Comments)    HALLUCINATES    Medications; Prior to Admission medications   Medication Sig Start Date End Date Taking? Authorizing Provider  acetaminophen (TYLENOL) 325 MG tablet Take 650 mg by mouth every 6 (six) hours as needed for moderate pain or fever.   Yes [provider]  apixaban (ELIQUIS) 2.5 MG TABS tablet Take 2.5 mg by mouth 2 (two) times daily.   Yes [provider]  atorvastatin (LIPITOR) 10 MG tablet Take 10 mg by mouth every evening.    Yes [provider]  calcitonin, salmon, (MIACALCIN/FORTICAL) 200 UNIT/ACT nasal spray Place 1 spray into alternate nostrils daily.   Yes [provider]  carvedilol (COREG) 25 MG tablet Take 25 mg by mouth 2 (two) times daily with a meal.   Yes [provider]  cholecalciferol (VITAMIN D3) 25 MCG (1000 UT) tablet Take 2,000 Units by mouth daily.   Yes [provider]  dextrose (GLUTOSE) 40 % GEL Take 1 Tube by mouth once as needed for low blood sugar.   Yes [provider]  diltiazem (DILACOR XR) 180 MG 24 hr capsule Take 180 mg by mouth daily.   Yes [provider]  escitalopram (LEXAPRO) 5 MG tablet Take 5 mg by mouth daily.   Yes [provider]  ferrous sulfate 325 (  65 FE) MG tablet Take 325 mg by mouth every other day.   Yes [provider]  fluconazole (DIFLUCAN) 150 MG tablet Take 150 mg by mouth daily. 04/09/18 04/11/18 Yes [provider]  gabapentin (NEURONTIN) 300 MG capsule Take 300 mg by mouth 2 (two) times daily.    Yes [provider]  glimepiride (AMARYL) 2 MG tablet Take 2 mg by mouth daily with breakfast.   Yes [provider]  insulin glargine (LANTUS) 100 UNIT/ML injection Inject 15 Units into the skin daily.   Yes [provider]  insulin regular (NOVOLIN R,HUMULIN R) 100 units/mL injection Inject 2-10 Units into the skin 2 (two) times daily before a meal. 100-150=0 units;  151-200=2 units; 201-250=4 units; 251-300=6 units; 301-350=8 units; 351-400= 10 units  >400 call md   Yes [provider]  magnesium hydroxide (MILK OF MAGNESIA) 400 MG/5ML suspension Take 30 mLs by mouth daily as needed for mild constipation.   Yes [provider]  potassium chloride SA (K-DUR,KLOR-CON) 20 MEQ tablet Take 40 mEq by mouth daily.   Yes [provider]  predniSONE (DELTASONE) 20 MG tablet Take 20 mg by mouth 2 (two) times daily with a meal.   Yes [provider]  senna (SENOKOT) 8.6 MG TABS tablet Take 1 tablet by mouth at bedtime.   Yes [provider]  torsemide (DEMADEX) 20 MG tablet Take 40 mg by mouth 2 (two) times daily.   Yes [provider]  valACYclovir (VALTREX) 500 MG tablet Take 500 mg by mouth 2 (two) times daily. 04/09/18 04/14/18 Yes [provider]  zolpidem (AMBIEN) 10 MG tablet Take 10 mg by mouth at bedtime.    Yes [provider]  ciprofloxacin (CIPRO) 500 MG tablet Take 1 tablet (500 mg total) by mouth 2 (two) times daily. Patient not taking: Reported on 04/11/2018 07/20/13   Romie Levee, MD  HYDROcodone-acetaminophen (NORCO/VICODIN) 5-325 MG per tablet Take 1 tablet by mouth every 6 (six) hours as needed. Patient not taking: Reported on 04/11/2018 07/20/13   Romie Levee, MD  metroNIDAZOLE (FLAGYL) 500 MG tablet Take 1 tablet (500 mg total) by mouth 3 (three) times daily. Patient not taking: Reported on 04/11/2018 07/20/13   Romie Levee, MD  nystatin (MYCOSTATIN) 100000 UNIT/ML suspension Take 5 mLs (500,000 Units total) by mouth 4 (four) times daily. Patient not taking: Reported on 04/11/2018 07/24/13   Gaynelle Adu, MD    PRN Meds acetaminophen, ondansetron **OR** ondansetron [facility]) IV Results for orders placed or performed during the hospital encounter of 04/10/18 (from the past 48 hour(s))  Occult blood, poc device     Status: Abnormal   Collection Time: 04/10/18  9:58 PM  Result Value  Ref Range   Fecal Occult Bld POSITIVE (A) NEGATIVE  ABO/Rh     Status: None   Collection Time: 04/10/18  9:59 PM  Result Value Ref Range   ABO/RH(D)      O POS Performed at [facility], [address], [place], [place] [address]   CBC with Differential/Platelet     Status: Abnormal   Collection Time: 04/10/18 10:07 PM  Result Value Ref Range   WBC 6.9 4.0 - 10.5 K/uL   RBC 2.38 (L) 3.87 - 5.11 MIL/uL   Hemoglobin 6.8 (LL) 12.0 - 15.0 g/dL    Comment: REPEATED TO VERIFY THIS CRITICAL RESULT HAS VERIFIED AND BEEN CALLED TO I HODGES RN BY AMELIA NAVARRO ON 04 06 2020 AT 2229, AND HAS  BEEN READ BACK.     HCT 22.2 (L) 36.0 - 46.0 %   MCV 93.3 80.0 - 100.0 fL   MCH 28.6 26.0 - 34.0 pg   MCHC 30.6 30.0 - 36.0 g/dL   RDW 16.1 (H) 09.6 - 04.5 %   Platelets 53 (L) 150 - 400 K/uL    Comment: REPEATED TO VERIFY PLATELET COUNT CONFIRMED BY SMEAR SPECIMEN CHECKED FOR CLOTS Immature Platelet Fraction may be clinically indicated, consider ordering this additional test [id number]    nRBC 1.3 (H) 0.0 - 0.2 %   Neutrophils Relative % 81 %   Neutro Abs 5.6 1.7 - 7.7 K/uL   Lymphocytes Relative 6 %   Lymphs Abs 0.4 (L) 0.7 - 4.0 K/uL   Monocytes Relative 10 %   Monocytes Absolute 0.7 0.1 - 1.0 K/uL   Eosinophils Relative 0 %   Eosinophils Absolute 0.0 0.0 - 0.5 K/uL   Basophils Relative 0 %   Basophils Absolute 0.0 0.0 - 0.1 K/uL   Immature Granulocytes 3 %   Abs Immature Granulocytes 0.23 (H) 0.00 - 0.07 K/uL    Comment: Performed at [facility], [address] [place], [place] [address]  Comprehensive metabolic panel     Status: Abnormal   Collection Time: 04/10/18 10:07 PM  Result Value Ref Range   Sodium 131 (L) 135 - 145 mmol/L   Potassium 5.4 (H) 3.5 - 5.1 mmol/L   Chloride 93 (L) 98 - 111 mmol/L   CO2 30 22 - 32 mmol/L   Glucose, Bld 123 (H) 70 - 99 mg/dL   BUN 60 (H) 8 - 23 mg/dL   Creatinine, Ser 5.62 0.44 - 1.00 mg/dL   Calcium 8.4  (L) 8.9 - 10.3 mg/dL   Total Protein 5.0 (L) 6.5 - 8.1 g/dL   Albumin 2.1 (L) 3.5 - 5.0 g/dL   AST 130 (H) 15 - 41 U/L   ALT 126 (H) 0 - 44 U/L   Alkaline Phosphatase 170 (H) 38 - 126 U/L   Total Bilirubin 0.4 0.3 - 1.2 mg/dL   GFR calc non Af Amer >60 >60 mL/min   GFR calc Af Amer >60 >60 mL/min   Anion gap 8 5 - 15    Comment: Performed at [facility], [address], [place], [place] [address]  Protime-INR     Status: None   Collection Time: 04/10/18 10:07 PM  Result Value Ref Range   Prothrombin Time 14.0 11.4 - 15.2 seconds   INR 1.1 0.8 - 1.2    Comment: (NOTE) INR goal varies based on device and disease states. Performed at [facility], [address] [place], [place] [address]   Type and screen     Status: None (Preliminary result)   Collection Time: 04/10/18 10:07 PM  Result Value Ref Range   ABO/RH(D) O POS    Antibody Screen NEG    Sample Expiration 04/13/2018    Unit Number [id number]    Blood Component Type RED CELLS,LR    Unit division 00    Status of Unit ISSUED    Transfusion Status OK TO TRANSFUSE    Crossmatch Result      Compatible Performed at [facility], [address], [place], [place] [address]   Brain natriuretic peptide     Status: Abnormal   Collection Time: 04/10/18 10:07 PM  Result Value Ref Range   B Natriuretic Peptide 335.9 (H) 0.0 - 100.0 pg/mL  Comment: Performed at [facility], [address] [place], [place] [address]  I-stat troponin, ED     Status: None   Collection Time: 04/10/18 10:16 PM  Result Value Ref Range   Troponin i, poc 0.01 0.00 - 0.08 ng/mL   Comment 3            Comment: Due to the release kinetics of cTnI, a negative result within the first hours of the onset of symptoms does not rule out myocardial infarction with certainty. If myocardial infarction is still suspected, repeat the test at appropriate intervals.   POCT I-Stat  EG7     Status: Abnormal   Collection Time: 04/10/18 10:20 PM  Result Value Ref Range   pH, Ven 7.492 (H) 7.250 - 7.430   pCO2, Ven 44.2 44.0 - 60.0 mmHg   pO2, Ven 43.0 32.0 - 45.0 mmHg   Bicarbonate 33.9 (H) 20.0 - 28.0 mmol/L   TCO2 35 (H) 22 - 32 mmol/L   O2 Saturation 82.0 %   Acid-Base Excess 10.0 (H) 0.0 - 2.0 mmol/L   Sodium 129 (L) 135 - 145 mmol/L   Potassium 5.6 (H) 3.5 - 5.1 mmol/L   Calcium, Ion 1.15 1.15 - 1.40 mmol/L   HCT 18.0 (L) 36.0 - 46.0 %   Hemoglobin 6.1 (LL) 12.0 - 15.0 g/dL   Patient temperature HIDE    Sample type VENOUS    Comment NOTIFIED PHYSICIAN   Prepare RBC     Status: None   Collection Time: 04/10/18 11:00 PM  Result Value Ref Range   Order Confirmation      ORDER PROCESSED BY BLOOD BANK Performed at  [facility], [address] [place], [place] [address]   CBC     Status: Abnormal   Collection Time: 04/11/18  1:01 AM  Result Value Ref Range   WBC 5.8 4.0 - 10.5 K/uL   RBC 2.10 (L) 3.87 - 5.11 MIL/uL   Hemoglobin 6.0 (LL) 12.0 - 15.0 g/dL    Comment: REPEATED TO VERIFY CRITICAL VALUE NOTED.  VALUE IS CONSISTENT WITH PREVIOUSLY REPORTED AND CALLED VALUE.    HCT 19.8 (L) 36.0 - 46.0 %   MCV 94.3 80.0 - 100.0 fL   MCH 28.6 26.0 - 34.0 pg   MCHC 30.3 30.0 - 36.0 g/dL   RDW 09.8 (H) 11.9 - 14.7 %   Platelets 53 (L) 150 - 400 K/uL    Comment: Immature Platelet Fraction may be clinically indicated, consider ordering this additional test [id number] CONSISTENT WITH PREVIOUS RESULT    nRBC 1.4 (H) 0.0 - 0.2 %    Comment: Performed at [facility], [address] [place], [place] [address]  CBC with Differential/Platelet     Status: Abnormal   Collection Time: 04/11/18  8:03 AM  Result Value Ref Range   WBC 6.1 4.0 - 10.5 K/uL   RBC 2.63 (L) 3.87 - 5.11 MIL/uL   Hemoglobin 7.5 (L) 12.0 - 15.0 g/dL    Comment: REPEATED TO VERIFY POST TRANSFUSION SPECIMEN DELTA CHECK NOTED    HCT 24.4 (L) 36.0 - 46.0  %   MCV 92.8 80.0 - 100.0 fL   MCH 28.5 26.0 - 34.0 pg   MCHC 30.7 30.0 - 36.0 g/dL   RDW 84.6 (H) 96.2 - 95.2 %   Platelets 46 (L) 150 - 400 K/uL    Comment: Immature Platelet Fraction may be clinically indicated, consider ordering this additional test [id number] CONSISTENT WITH PREVIOUS  RESULT    nRBC 1.0 (H) 0.0 - 0.2 %   Neutrophils Relative % 77 %   Neutro Abs 4.7 1.7 - 7.7 K/uL   Lymphocytes Relative 9 %   Lymphs Abs 0.6 (L) 0.7 - 4.0 K/uL   Monocytes Relative 10 %   Monocytes Absolute 0.6 0.1 - 1.0 K/uL   Eosinophils Relative 0 %   Eosinophils Absolute 0.0 0.0 - 0.5 K/uL   Basophils Relative 0 %   Basophils Absolute 0.0 0.0 - 0.1 K/uL   Immature Granulocytes 4 %   Abs Immature Granulocytes 0.23 (H) 0.00 - 0.07 K/uL   Polychromasia PRESENT     Comment: Performed at [facility], [address] [place], [place] [address]    Ct Head Wo Contrast  Result Date: 04/10/2018 CLINICAL DATA:  Altered level of consciousness. Weakness and lethargy. EXAM: CT HEAD WITHOUT CONTRAST TECHNIQUE: Contiguous axial images were obtained from the base of the skull through the vertex without intravenous contrast. COMPARISON:  Head CT 11/09/2017 FINDINGS: Brain: No intracranial hemorrhage, mass effect, or midline shift. Brain volume is normal for age. No hydrocephalus. The basilar cisterns are patent. No evidence of territorial infarct or acute ischemia. No extra-axial or intracranial fluid collection. Vascular: Atherosclerosis of skullbase vasculature without hyperdense vessel or abnormal calcification. Skull: No fracture or focal lesion. Sinuses/Orbits: No acute findings. Bilateral cataract resection. Other: Multiple small metallic densities in the left temporal scalp in just lateral to the superior left orbit are new from prior exam, no associated air. IMPRESSION: 1. No acute intracranial abnormality. 2. Punctate metallic densities in the left frontal scalp soft tissue are new from  November 2019 but do not appear acute. Electronically Signed   By: Narda Rutherford M.D.   On: 04/10/2018 22:46   Dg Chest Port 1 View  Result Date: 04/10/2018 CLINICAL DATA:  Shortness of breath. Lethargy. EXAM: PORTABLE CHEST 1 VIEW COMPARISON:  Radiograph 03/12/2018. Chest CT 02/19/2018 FINDINGS: The patient is rotated. Cardiomegaly is unchanged from prior exam. Right perihilar opacities likely enlarged pulmonary arteries as seen on CT. Improved right basilar aeration from prior exam. Persistent blunting of the left costophrenic angle. Decreased vascular congestion. No pneumothorax. The bones are under mineralized. IMPRESSION: 1. Stable cardiomegaly. Right hilar prominence is likely enlarged pulmonary artery accentuated by rotation. Decreased vascular congestion since last month. 2. Improved right basilar aeration from prior exam. Persistent blunting of left costophrenic angle, improved from prior and likely a small amount of pleural fluid. Electronically Signed   By: Narda Rutherford M.D.   On: 04/10/2018 22:36               Blood pressure (!) 107/47, pulse (!) 51, temperature 97.7 F (36.5 C), temperature source Oral, resp. rate 18, height 5\' 1"  (1.549 m), weight 47.2 kg, SpO2 95 %.  Physical exam:   General--frail white female in no distress  ENT--dried blood on lips Neck--supple Heart--irregular Lungs--grossly clear Abdomen--nondistended and soft Psych--slightly confused but gives pretty good history   Assessment: 1.  Upper GI bleed.  This is occurring in a woman who is anticoagulated.  Hopefully she will quit bleeding as her coagulation improves and we can then decide about EGD or upper GI. 2.  A. fib, anticoagulated at nursing home 3.  Recent fall.  This is caused her to be transferred to nursing home.  Plan: We will follow her with you.  We will go ahead and allow clear liquids today and allow the Eliquis to get out  of her system and will see at that point if we need to  do endoscopy or upper GI.   [name] 04/11/2018, 11:41 AM   This note was created using voice recognition software and minor errors may Have occurred unintentionally. Pager: [phone] If no answer or after hours call [phone]

## 2018-04-12 ENCOUNTER — Inpatient Hospital Stay (HOSPITAL_COMMUNITY): Payer: Medicare Other

## 2018-04-12 DIAGNOSIS — T68XXXA Hypothermia, initial encounter: Secondary | ICD-10-CM

## 2018-04-12 DIAGNOSIS — R001 Bradycardia, unspecified: Secondary | ICD-10-CM

## 2018-04-12 DIAGNOSIS — K922 Gastrointestinal hemorrhage, unspecified: Secondary | ICD-10-CM

## 2018-04-12 DIAGNOSIS — R5383 Other fatigue: Secondary | ICD-10-CM

## 2018-04-12 DIAGNOSIS — E875 Hyperkalemia: Secondary | ICD-10-CM

## 2018-04-12 DIAGNOSIS — I959 Hypotension, unspecified: Secondary | ICD-10-CM

## 2018-04-12 LAB — CBC WITH DIFFERENTIAL/PLATELET
Abs Immature Granulocytes: 0.11 10*3/uL — ABNORMAL HIGH (ref 0.00–0.07)
Abs Immature Granulocytes: 0.11 10*3/uL — ABNORMAL HIGH (ref 0.00–0.07)
Basophils Absolute: 0 10*3/uL (ref 0.0–0.1)
Basophils Absolute: 0 10*3/uL (ref 0.0–0.1)
Basophils Relative: 0 %
Basophils Relative: 0 %
Eosinophils Absolute: 0 10*3/uL (ref 0.0–0.5)
Eosinophils Absolute: 0 10*3/uL (ref 0.0–0.5)
Eosinophils Relative: 0 %
Eosinophils Relative: 0 %
HCT: 34.4 % — ABNORMAL LOW (ref 36.0–46.0)
HCT: 21 % — ABNORMAL LOW (ref 36.0–46.0)
Hemoglobin: 10.3 g/dL — ABNORMAL LOW (ref 12.0–15.0)
Hemoglobin: 6.5 g/dL — CL (ref 12.0–15.0)
Immature Granulocytes: 2 %
Immature Granulocytes: 1 %
Lymphocytes Relative: 3 %
Lymphocytes Relative: 4 %
Lymphs Abs: 0.2 10*3/uL — ABNORMAL LOW (ref 0.7–4.0)
Lymphs Abs: 0.3 10*3/uL — ABNORMAL LOW (ref 0.7–4.0)
MCH: 28.1 pg (ref 26.0–34.0)
MCH: 28.8 pg (ref 26.0–34.0)
MCHC: 29.9 g/dL — ABNORMAL LOW (ref 30.0–36.0)
MCHC: 31 g/dL (ref 30.0–36.0)
MCV: 93.7 fL (ref 80.0–100.0)
MCV: 92.9 fL (ref 80.0–100.0)
Monocytes Absolute: 0.5 10*3/uL (ref 0.1–1.0)
Monocytes Absolute: 1 10*3/uL (ref 0.1–1.0)
Monocytes Relative: 9 %
Monocytes Relative: 12 %
Neutro Abs: 4.6 10*3/uL (ref 1.7–7.7)
Neutro Abs: 6.9 10*3/uL (ref 1.7–7.7)
Neutrophils Relative %: 86 %
Neutrophils Relative %: 83 %
Platelets: 32 10*3/uL — ABNORMAL LOW (ref 150–400)
Platelets: 40 10*3/uL — ABNORMAL LOW (ref 150–400)
RBC: 3.67 MIL/uL — ABNORMAL LOW (ref 3.87–5.11)
RBC: 2.26 MIL/uL — ABNORMAL LOW (ref 3.87–5.11)
RDW: 18.7 % — ABNORMAL HIGH (ref 11.5–15.5)
RDW: 18.3 % — ABNORMAL HIGH (ref 11.5–15.5)
WBC: 5.4 10*3/uL (ref 4.0–10.5)
WBC: 8.3 10*3/uL (ref 4.0–10.5)
nRBC: 2 % — ABNORMAL HIGH (ref 0.0–0.2)
nRBC: 0.5 % — ABNORMAL HIGH (ref 0.0–0.2)

## 2018-04-12 LAB — GLUCOSE, CAPILLARY
Glucose-Capillary: 145 mg/dL — ABNORMAL HIGH (ref 70–99)
Glucose-Capillary: 85 mg/dL (ref 70–99)
Glucose-Capillary: 111 mg/dL — ABNORMAL HIGH (ref 70–99)
Glucose-Capillary: 147 mg/dL — ABNORMAL HIGH (ref 70–99)
Glucose-Capillary: 66 mg/dL — ABNORMAL LOW (ref 70–99)

## 2018-04-12 LAB — COMPREHENSIVE METABOLIC PANEL
ALT: 357 U/L — ABNORMAL HIGH (ref 0–44)
AST: 377 U/L — ABNORMAL HIGH (ref 15–41)
Albumin: 2.2 g/dL — ABNORMAL LOW (ref 3.5–5.0)
Alkaline Phosphatase: 137 U/L — ABNORMAL HIGH (ref 38–126)
Anion gap: 7 (ref 5–15)
BUN: 51 mg/dL — ABNORMAL HIGH (ref 8–23)
CO2: 21 mmol/L — ABNORMAL LOW (ref 22–32)
Calcium: 8 mg/dL — ABNORMAL LOW (ref 8.9–10.3)
Chloride: 107 mmol/L (ref 98–111)
Creatinine, Ser: 0.78 mg/dL (ref 0.44–1.00)
GFR calc Af Amer: >60 mL/min (ref >60)
GFR calc non Af Amer: >60 mL/min (ref >60)
Glucose, Bld: 233 mg/dL — ABNORMAL HIGH (ref 70–99)
Potassium: 5.1 mmol/L (ref 3.5–5.1)
Sodium: 135 mmol/L (ref 135–145)
Total Bilirubin: 0.5 mg/dL (ref 0.3–1.2)
Total Protein: 4.1 g/dL — ABNORMAL LOW (ref 6.5–8.1)

## 2018-04-12 LAB — CBC
HCT: 20.4 % — ABNORMAL LOW (ref 36.0–46.0)
Hemoglobin: 6.4 g/dL — CL (ref 12.0–15.0)
MCH: 28.8 pg (ref 26.0–34.0)
MCHC: 31.4 g/dL (ref 30.0–36.0)
MCV: 91.9 fL (ref 80.0–100.0)
Platelets: 38 10*3/uL — ABNORMAL LOW (ref 150–400)
RBC: 2.22 MIL/uL — ABNORMAL LOW (ref 3.87–5.11)
RDW: 18.1 % — ABNORMAL HIGH (ref 11.5–15.5)
WBC: 8 10*3/uL (ref 4.0–10.5)
nRBC: 0.8 % — ABNORMAL HIGH (ref 0.0–0.2)

## 2018-04-12 LAB — BASIC METABOLIC PANEL
Anion gap: 9 (ref 5–15)
BUN: 48 mg/dL — ABNORMAL HIGH (ref 8–23)
CO2: 21 mmol/L — ABNORMAL LOW (ref 22–32)
Calcium: 7.6 mg/dL — ABNORMAL LOW (ref 8.9–10.3)
Chloride: 105 mmol/L (ref 98–111)
Creatinine, Ser: 0.76 mg/dL (ref 0.44–1.00)
GFR calc Af Amer: >60 mL/min (ref >60)
GFR calc non Af Amer: >60 mL/min (ref >60)
Glucose, Bld: 233 mg/dL — ABNORMAL HIGH (ref 70–99)
Potassium: 6.4 mmol/L (ref 3.5–5.1)
Sodium: 135 mmol/L (ref 135–145)

## 2018-04-12 LAB — LACTIC ACID, PLASMA
Lactic Acid, Venous: 6.2 mmol/L (ref 0.5–1.9)
Lactic Acid, Venous: 2.8 mmol/L (ref 0.5–1.9)
Lactic Acid, Venous: 1.7 mmol/L (ref 0.5–1.9)

## 2018-04-12 LAB — PREPARE RBC (CROSSMATCH)

## 2018-04-12 MED ORDER — PIPERACILLIN-TAZOBACTAM 3.375 G IVPB 30 MIN
3.3750 g | INTRAVENOUS | Status: AC
  Administered 2018-04-12: 3.375 g via INTRAVENOUS
  Filled 2018-04-12 (×2): qty 50

## 2018-04-12 MED ORDER — CHLORHEXIDINE GLUCONATE 0.12 % MT SOLN
15.0000 mL | Freq: Two times a day (BID) | OROMUCOSAL | Status: DC
  Administered 2018-04-13 – 2018-04-14 (×2): 15 mL via OROMUCOSAL
  Filled 2018-04-12: qty 15

## 2018-04-12 MED ORDER — DEXTROSE 50 % IV SOLN
INTRAVENOUS | Status: AC
  Filled 2018-04-12: qty 50

## 2018-04-12 MED ORDER — SODIUM CHLORIDE 0.9 % IV BOLUS
500.0000 mL | Freq: Once | INTRAVENOUS | Status: AC
  Administered 2018-04-12: 500 mL via INTRAVENOUS

## 2018-04-12 MED ORDER — CALCIUM GLUCONATE-NACL 1-0.675 GM/50ML-% IV SOLN
1.0000 g | Freq: Once | INTRAVENOUS | Status: AC
  Administered 2018-04-12: 1000 mg via INTRAVENOUS
  Filled 2018-04-12: qty 50

## 2018-04-12 MED ORDER — SODIUM CHLORIDE 0.9 % IV BOLUS: 500.0000 mL | Freq: Once | INTRAVENOUS | Status: AC

## 2018-04-12 MED ORDER — DEXTROSE 50 % IV SOLN
1.0000 | Freq: Once | INTRAVENOUS | Status: AC
  Administered 2018-04-12: 50 mL via INTRAVENOUS
  Filled 2018-04-12: qty 50

## 2018-04-12 MED ORDER — PIPERACILLIN-TAZOBACTAM 3.375 G IVPB
3.3750 g | Freq: Three times a day (TID) | INTRAVENOUS | Status: DC
  Administered 2018-04-12 – 2018-04-13 (×4): 3.375 g via INTRAVENOUS
  Filled 2018-04-12 (×4): qty 50

## 2018-04-12 MED ORDER — DEXTROSE-NACL 5-0.9 % IV SOLN
INTRAVENOUS | Status: DC
  Administered 2018-04-12 – 2018-04-13 (×2): via INTRAVENOUS

## 2018-04-12 MED ORDER — ORAL CARE MOUTH RINSE
15.0000 mL | Freq: Two times a day (BID) | OROMUCOSAL | Status: DC
  Administered 2018-04-14: 15 mL via OROMUCOSAL

## 2018-04-12 MED ORDER — SODIUM CHLORIDE 0.9 % IV SOLN
INTRAVENOUS | Status: DC
  Administered 2018-04-12: 16:00:00 via INTRAVENOUS

## 2018-04-12 MED ORDER — DEXTROSE 50 % IV SOLN
12.5000 g | INTRAVENOUS | Status: AC
  Administered 2018-04-12: 12.5 g via INTRAVENOUS

## 2018-04-12 MED ORDER — SODIUM CHLORIDE 0.9% IV SOLUTION
Freq: Once | INTRAVENOUS | Status: AC
  Administered 2018-04-12: 08:00:00 via INTRAVENOUS

## 2018-04-12 MED ORDER — INSULIN ASPART 100 UNIT/ML ~~LOC~~ SOLN
10.0000 [IU] | Freq: Once | SUBCUTANEOUS | Status: AC
  Administered 2018-04-12: 10 [IU] via SUBCUTANEOUS

## 2018-04-12 MED ORDER — ALBUMIN HUMAN 25 % IV SOLN
12.5000 g | Freq: Once | INTRAVENOUS | Status: AC
  Administered 2018-04-12: 12.5 g via INTRAVENOUS
  Filled 2018-04-12: qty 50

## 2018-04-12 NOTE — Progress Notes (Addendum)
Shift event note: Notified by RN and AC just after MN regarding pt w/ persistent Bradycardia w/ HR of 40-50's sometimes dipping into 20-30's , hypothermia and worsening  hypotension. Pt very lethargic and pale. Record reviewed. This NP assessed pt at bedside where pt noted resting in NAD. She responds to voice but appears unable to stay awake. T-93.5 P-48, RR-20 w/ 02 sats of 97% on 4L [place]. BBS CTA. Skin is cool to touch and pale. Pt has just had a moderate amount of melonic stool per RN. UOP has been conservative but adequate. A 500 cc NS IVFB initiated. CBC, BMET, EKG and lactate ordered. Spoke w/ J. Carlynn Purl, RN regarding necessity of a warming blanket and ability to use on the floor as this is usually a SDU level of care. (Our ICU/SDU is full). He has authorized use of [employer] on the floor.  Assessment/Plan: 1. Bradycardia: In setting of pt w/ h/o chronic a-fib and chronic diastolic heart failure. Receiving Cardizem and Coreg.  EKG tonight shows Sinus Bradycardia w/ rate of 53 and a non-specific ST abnormality. Torsemide remains on hold. Will place Cardizem and Coreg on hold until HR/BP improves. Likely exacerbated by hypothermia.  2. Hypotension: Likely multifactorial given acute illness, anemia, GIB and cardiac history. Cardizem and Coreg placed on hold. Rounding MD can re-evaluate need to restart in am if HR/BP improves. Torsemide remains on hold. Even with bolus still infusing BP improved to 98/51. Will give Albumin and additional IVF's w/ caution.  3. Hypothermia: Will place Bair hugger blanket until temperature normalizes. 4. Lethargy: Pt did receive Tramadol and Levsin for back and abd pain just before MN. Likely exacerbated by Bradycardia and hypotension. Appeared to become somewhat more responsive while this NP at bedside, attempted to talk and even smiling at times.  5. Code status: Called and spoke w/ daughter "[name]" as it initially appeared pt may need tx to SDU/ICU for pressors.  Discussed code status in the event pt stops breathing or heart stops. Daughter states she would be in favor of meds to help BP but no CPR or intubation. She relates that this is congruent w/ past conversation she and her mother have had in the past. Code status changed to Partial Code.  VS improved slightly while being assessed. Will allow pt to remain on floor on bair hugger and additional IVFB. Follow labs and address as indicated. Will continue to monitor closely on telemetry.  Leanne Chang, NP-C [facility] Pager [phone]  CRITICAL CARE Performed by: Leanne Chang   Total critical care time: 120 minutes  Critical care time was exclusive of separately billable procedures and treating other patients. Critical care was necessary to treat or prevent imminent or life-threatening deterioration. Critical care was time spent personally by me on the following activities: development of treatment plan with patient and/or surrogate as well as nursing, discussions with consultants, evaluation of patient's response to treatment, examination of patient, obtaining history from patient or surrogate, ordering and performing treatments and interventions, ordering and review of laboratory studies, ordering and review of radiographic studies, pulse oximetry and re-evaluation of patient's condition.  0300: Addendum: Notified By RN K+ 6.4 (Bun/Creatinine 48/0.76) Supplements have been on hold but K+ continues to rise. No EKG changes. Orders placed for Calcium Gluconate/Dextrose and IV insulin. Lactate 6.2, Will give IVF's w/ caution and repeat labs in am. SBP remains 90 or >. Temp is normalizing at 95.8. RN reports pt slightly more reactive.

## 2018-04-12 NOTE — Progress Notes (Signed)
At 2130 pt temp 93.8 F rectally, desaturated to 81% on 4L via [place]. Placed on 10 L via high flow [place], sating up to 95%. Denied SOB. BP 123/59, hr 50's. BG 66, hypoglycemic protocol followed- see MAR. Bair hugger applied w/ sheet inbetween pt and device. RRT nurse consulted and K Schorr NP  paged, advised to do q2 temp checks at this time. Fluids changed to D5 NS. Pt denied pain throughout.    Daughter called and was updated.

## 2018-04-12 NOTE — Progress Notes (Signed)
Pt's temperature 93.5 rectally. Pt covered with warm blankets and heating pads. Temp remains 93.4 rectally. NP on call, Schorr, at bedside to assess pt. Recommends bear-hugger. At this time, there was only 1 bed available in ICU/stepdown. Approval from [facility] given to initiate warming blanket on the unit. Warming blanket placed on pt. New orders initiated and followed. Will continue to monitor closely.

## 2018-04-12 NOTE — Progress Notes (Addendum)
PROGRESS NOTE  [name]:[id number] DOB: May 23, 1932 DOA: 04/10/2018 PCP: Gordan Payment., MD   LOS: 2 days   Brief narrative:  85 y.o. female A. fib on Eliquis, hypertension, hyperlipidemia, diabetes mellitus, coronary artery disease, chronic diastolic congestive heart failure, ascending aortic aneurysm, chronic anemia, history of temporal arteritis, osteoporosis, history of rectovaginal fistula and osteoarthritis.  sent from SNF on 4/6 with generalized weakness, fatigue, increased oxygen requirement, hypotension and hemoglobin low at 7.3.     ER-melena, guaiac positive stool.   Hemoglobin was initially 6.8 and on recheck dropped to 6.1.    transfused 1 unit of blood overnight and admitted for evaluation of GI bleeding.    Subjective:  Poorly responsive this am stirred minimally from my assessmnt, Nursing states she isnt safe to take PO at this time given lethargy rec'd Ambien last pm  Assessment/Plan:  Principal Problem:   Symptomatic anemia Active Problems:   Hyponatremia   Hyperkalemia   Chronic a-fib   HTN (hypertension)   Gastrointestinal hemorrhage   Bradycardia, sinus   Hypotension   Lethargy   Hypothermia  Acute blood loss anemia/GI bleeding -Hematochezia and setting Eliquis intake. - Transfused next one 4/6, blood count still low== blood count still low and confirmed at 6.5 transfusing another unit of blood - Eliquis on hold -GI consulted, IV PPI GTT restarting [IV access low] . Clear liquid diet hled.  Holding anticoagulation-defer planning to them-await more stability but GI may need to scope sooner than later - Continue iron supplementation  ?Aspiration Get stat 1 vw CXR--she is lethargic and has crackles on lung on L decubitus  Cardiovascular issues : Hypertension/ hyperlipidemia/ chronic A. Fib/ chronic diastolic heart failure/ coronary artery disease/ascending aortic aneurysm - Has distributive/hypovolemic shock-like features with low temperature  and low blood pressure--see above - PO cardizem, eliquis,BB  Torsemide no all on hold- Continue statin  Type 2 diabetes mellitus - Home list mentions Lantus 15 units nightly as well as sliding scale insulin.  Blood sugar was 123 on initial blood work.  Monitor CBG.  Restart Lantus as needed.   - Glimepiride on hold - Continue gabapentin  Hypervolemic hyponatremia:   - Sodium level acutely low at 129 at presentation.   - pta torsemide 40-stop normal saline now-gfollow CXR -exam suggest mild Vol ?  ? torsemide once blood pressure improves.    Hyperkalemia:  - Potassium level was elevated to 5.4.  Now down to 5.1- Torsemide remains on hold.  Hold potassium supplement.  History of temporal arteritis -Noted 1 of the previous documentations.  Patient is on prednisone 20 mg 2 times daily.  Not sure if this is for temporal arteritis or COPD. Resume the same.   History of insomnia - Continue Ambien  Dementia  - Continue supportive care.  Patient does not seem to be oriented to time.  Unable to clarify details of her baseline mobility and mental status.  Body mass index is 19.65 kg/m. Diet: Clear liquid diet DVT prophylaxis:  SCDs Code Status:   Code Status: Partial Code  Family Communication:  long discussion with daughter on phone, answering all details of care Patient seems like shemight be septic Dr Randa Evens doesn't think NM bleed scan wopuld offer much as wouldn't change management as she isnt manifesting with large amounts hemetemsis nor melena WIll follow along closely today and if prn, might nee dot update daughter, who confirms only PRESSORS, no intubation, no CPR Disposition Plan:  Back to SNF after GI intervention  Consultants:  GI  Procedures:  Not yet  Antimicrobials:  Anti-infectives (From admission, onward)   Start     Dose/Rate Route Frequency Ordered Stop   04/11/18 1230  valACYclovir (VALTREX) tablet 500 mg     500 mg Oral 2 times daily 04/11/18 1226         Infusions:  . pantoprozole (PROTONIX) infusion 8 mg/hr (04/12/18 0018)    Scheduled Meds: . sodium chloride   Intravenous Once  . atorvastatin  10 mg Oral QPM  . calcitonin (salmon)  1 spray Alternating Nares Daily  . escitalopram  5 mg Oral Daily  . ferrous sulfate  325 mg Oral QODAY  . gabapentin  300 mg Oral BID  . insulin aspart  0-5 Units Subcutaneous QHS  . insulin aspart  0-9 Units Subcutaneous TID WC  . predniSONE  20 mg Oral BID WC  . senna  1 tablet Oral QHS  . valACYclovir  500 mg Oral BID  . zolpidem  5 mg Oral QHS    PRN meds: acetaminophen, ondansetron **OR** ondansetron (ZOFRAN) IV, traMADol   Objective: Vitals:   04/12/18 0257 04/12/18 0511  BP: (!) 90/45 (!) 92/51  Pulse: 60 64  Resp: 20 (!) 21  Temp: (!) 95.8 F (35.4 C) 99.1 F (37.3 C)  SpO2:  94%    Intake/Output Summary (Last 24 hours) at 04/12/2018 0700 Last data filed at 04/12/2018 0300 Gross per 24 hour  Intake 1721.11 ml  Output 1050 ml  Net 671.11 ml   Filed Weights   04/10/18 2137  Weight: 47.2 kg   Weight change:  Body mass index is 19.65 kg/m.   Physical Exam: eomi ncat GCS ~ 13 Dry mucosa, blood at L nare, CTA b s1 s 2no m/r/g abd soft nt nd no rebound no guard Neuro canot assess Moaning and groaning   Data Review: I have personally reviewed the laboratory data and studies available.  Recent Labs  Lab 04/10/18 2207  04/11/18 0101 04/11/18 0803 04/12/18 0119 04/12/18 0504 04/12/18 0624  WBC 6.9  --  5.8 6.1 5.4 8.0 8.3  NEUTROABS 5.6  --   --  4.7 4.6  --  6.9  HGB 6.8*   < > 6.0* 7.5* 10.3* 6.4* 6.5*  HCT 22.2*   < > 19.8* 24.4* 34.4* 20.4* 21.0*  MCV 93.3  --  94.3 92.8 93.7 91.9 92.9  PLT 53*  --  53* 46* 32* 38* 40*   < > = values in this interval not displayed.   Recent Labs  Lab 04/10/18 2207 04/10/18 2220 04/12/18 0119 04/12/18 0504  NA 131* 129* 135 135  K 5.4* 5.6* 6.4* 5.1  CL 93*  --  105 107  CO2 30  --  21* 21*  GLUCOSE 123*  --  233* 233*   BUN 60*  --  48* 51*  CREATININE 0.87  --  0.76 0.78  CALCIUM 8.4*  --  7.6* 8.0*    Rhetta Mura, MD  [facility] 04/12/2018      CRITICAL CARE Performed by: Rhetta Mura   Total critical care time: 43 minutes  Critical care time was exclusive of separately billable procedures and treating other patients.  Critical care was necessary to treat or prevent imminent or life-threatening deterioration.  Critical care was time spent personally by me on the following activities: development of treatment plan with patient and/or surrogate as well as nursing, discussions with consultants, evaluation of patient's response to treatment, examination of patient, obtaining history  from patient or surrogate, ordering and performing treatments and interventions, ordering and review of laboratory studies, ordering and review of radiographic studies, pulse oximetry and re-evaluation of patient's condition.

## 2018-04-12 NOTE — Progress Notes (Signed)
Pharmacy Antibiotic Note  [name] is a 85 y.o. female admitted on 04/10/2018 with upper GI bleed. Upon assessment by MD today, concern for aspiration. Pharmacy has been consulted for Zosyn dosing.  Plan:  Zosyn 3.375g IV x 1 over 30 minutes, then Zosyn 3.375g IV q8h (each dose infused over 4 hours)  Monitor renal function, cultures as available, clinical course.    Height: 5\' 1"  (154.9 cm) Weight: 104 lb (47.2 kg) IBW/kg (Calculated) : 47.8  Temp (24hrs), Avg:96.6 F (35.9 C), Min:93.4 F (34.1 C), Max:99.1 F (37.3 C)  Recent Labs  Lab 04/10/18 2207 04/11/18 0101 04/11/18 0803 04/12/18 0119 04/12/18 0504 04/12/18 0624  WBC 6.9 5.8 6.1 5.4 8.0 8.3  CREATININE 0.87  --   --  0.76 0.78  --   LATICACIDVEN  --   --   --  6.2* 2.8*  --     Estimated Creatinine Clearance: 39 mL/min (by C-G formula based on SCr of 0.78 mg/dL).    Allergies  Allergen Reactions  . Fosamax [Alendronate Sodium] Other (See Comments)    Gi upset  . Hydrocodone-Homatropine Other (See Comments)    Unknown  . Losartan Potassium Other (See Comments)    Unknown  . Oxycodone Other (See Comments)    HALLUCINATES    Antimicrobials this admission: 4/8 Zosyn >>  Dose adjustments this admission: --  Microbiology results: none  Thank you for allowing pharmacy to be a part of this patient's care.   Greer Pickerel, PharmD, BCPS Pager: [phone] 04/12/2018 10:24 AM

## 2018-04-12 NOTE — Plan of Care (Signed)
  Problem: Coping: Goal: Level of anxiety will decrease Outcome: Progressing   Problem: Elimination: Goal: Will not experience complications related to bowel motility Outcome: Progressing Goal: Will not experience complications related to urinary retention Outcome: Progressing   Problem: Safety: Goal: Ability to remain free from injury will improve Outcome: Progressing   Problem: Skin Integrity: Goal: Risk for impaired skin integrity will decrease Outcome: Progressing   

## 2018-04-12 NOTE — Evaluation (Signed)
SLP Cancellation Note  Patient Details Name: [name] MRN: [id number] DOB: [birth year]   Cancelled treatment:       Reason Eval/Treat Not Completed: Fatigue/lethargy limiting ability to participate;Other (comment)(pt lethargic, will follow up next date for evaluation)   Chales Abrahams 04/12/2018, 5:43 PM   Donavan Burnet, MS [facility] SLP Acute Rehab Services Pager [phone] Office [phone]

## 2018-04-12 NOTE — Progress Notes (Signed)
.  CRITICAL VALUE ALERT  Critical Value: Hemoglobin 6.4  Date & Time Notied:  04/12/2018 0540  Provider Notified: Merdis Delay, NP  Orders Received/Actions taken: will transfuse 1 unit once blood is prepared

## 2018-04-12 NOTE — Progress Notes (Addendum)
[facility] PROGRESS NOTE Subjective Without further gross bleeding but was responsive and BP 90s over 50.  Now has been 2 days off of the Eliquis.  Objective: Vital signs in last 24 hours: Temp:  [93.4 F (34.1 C)-99.1 F (37.3 C)] 97.3 F (36.3 C) (04/08 0950) Pulse Rate:  [48-64] 56 (04/08 0950) Resp:  [12-21] 12 (04/08 0950) BP: (64-114)/(42-56) 95/53 (04/08 0950) SpO2:  [91 %-100 %] 100 % (04/08 0950) Last BM Date: 04/12/18  Intake/Output from previous day: 04/07 0701 - 04/08 0700 In: 1721.1 [P.O.:400; I.V.:784.2; IV Piggyback:536.9] Out: 1050 [Urine:1050] Intake/Output this shift: Total I/O In: 250 [I.V.:250] Out: 200 [Urine:200]  PE: General--patient responds to painful stimuli does not respond otherwise. Heart--seems to be regular Lungs--grossly clear Abdomen--nondistended and nontender Rectal--stool formed, not impacted, brown.  No signs of blood.  Lab Results: Recent Labs    04/11/18 0101 04/11/18 0803 04/12/18 0119 04/12/18 0504 04/12/18 0624  WBC 5.8 6.1 5.4 8.0 8.3  HGB 6.0* 7.5* 10.3* 6.4* 6.5*  HCT 19.8* 24.4* 34.4* 20.4* 21.0*  PLT 53* 46* 32* 38* 40*   BMET Recent Labs    04/10/18 2207 04/10/18 2220 04/12/18 0119 04/12/18 0504  NA 131* 129* 135 135  K 5.4* 5.6* 6.4* 5.1  CL 93*  --  105 107  CO2 30  --  21* 21*  CREATININE 0.87  --  0.76 0.78   LFT Recent Labs    04/10/18 2207 04/12/18 0504  PROT 5.0* 4.1*  AST 136* 377*  ALT 126* 357*  ALKPHOS 170* 137*  BILITOT 0.4 0.5   PT/INR Recent Labs    04/10/18 2207  LABPROT 14.0  INR 1.1   PANCREAS No results for input(s): LIPASE in the last 72 hours.       Studies/Results: Ct Head Wo Contrast  Result Date: 04/10/2018 CLINICAL DATA:  Altered level of consciousness. Weakness and lethargy. EXAM: CT HEAD WITHOUT CONTRAST TECHNIQUE: Contiguous axial images were obtained from the base of the skull through the vertex without intravenous contrast. COMPARISON:  Head CT  11/09/2017 FINDINGS: Brain: No intracranial hemorrhage, mass effect, or midline shift. Brain volume is normal for age. No hydrocephalus. The basilar cisterns are patent. No evidence of territorial infarct or acute ischemia. No extra-axial or intracranial fluid collection. Vascular: Atherosclerosis of skullbase vasculature without hyperdense vessel or abnormal calcification. Skull: No fracture or focal lesion. Sinuses/Orbits: No acute findings. Bilateral cataract resection. Other: Multiple small metallic densities in the left temporal scalp in just lateral to the superior left orbit are new from prior exam, no associated air. IMPRESSION: 1. No acute intracranial abnormality. 2. Punctate metallic densities in the left frontal scalp soft tissue are new from November 2019 but do not appear acute. Electronically Signed   By: Narda RutherfordMelanie  Sanford M.D.   On: 04/10/2018 22:46   Dg Chest Port 1 View  Result Date: 04/12/2018 CLINICAL DATA:  Pneumonia EXAM: PORTABLE CHEST 1 VIEW COMPARISON:  04/10/2018 FINDINGS: Cardiomegaly. Small left pleural effusion. Left lower lobe opacity, likely atelectasis. Mild vascular congestion. Soft tissue density projects over the right upper lobe which appears to be external to the patient. Right hilar prominence is stable, likely related to pulmonary artery. Mild hyperinflation. IMPRESSION: Cardiomegaly. Prominent right hilum, likely related to enlarged pulmonary artery and possible pulmonary arterial hypertension. Soft tissue density projects over the right upper lobe which appears to be external to the patient. Small left effusion.  Left lower lobe atelectasis. Electronically Signed   By: Charlett NoseKevin  Dover M.D.  On: 04/12/2018 09:37   Dg Chest Port 1 View  Result Date: 04/10/2018 CLINICAL DATA:  Shortness of breath. Lethargy. EXAM: PORTABLE CHEST 1 VIEW COMPARISON:  Radiograph 03/12/2018. Chest CT 02/19/2018 FINDINGS: The patient is rotated. Cardiomegaly is unchanged from prior exam. Right  perihilar opacities likely enlarged pulmonary arteries as seen on CT. Improved right basilar aeration from prior exam. Persistent blunting of the left costophrenic angle. Decreased vascular congestion. No pneumothorax. The bones are under mineralized. IMPRESSION: 1. Stable cardiomegaly. Right hilar prominence is likely enlarged pulmonary artery accentuated by rotation. Decreased vascular congestion since last month. 2. Improved right basilar aeration from prior exam. Persistent blunting of left costophrenic angle, improved from prior and likely a small amount of pleural fluid. Electronically Signed   By: Narda Rutherford M.D.   On: 04/10/2018 22:36    Medications: I have reviewed the patient's current medications.  Assessment:   1.  GI bleed.  Had clear bleeding when she came in and was anticoagulated.  Has been off of Eliquis now almost 2 days.  Her stool was brown and there is no obvious signs of continued bleeding.  Her hemoglobin is down some. 2.  Hypotension, altered mental status.  Because of this is not clear but I would wonder if she could be septic for some reason.  There does not seem to be any obvious GI bleeding to account for this.   Plan: Agree with empiric antibiotics.  We will continue the patient on Protonix IV as she is particularly since she seems to be somewhat obtunded and is unlikely to be able to tolerate oral medications.  At this point, without any signs of active bleeding I will be hesitant to perform endoscopy.  We will follow closely with you.   Tresea Mall 04/12/2018, 11:10 AM  This note was created using voice recognition software. Minor errors may Have occurred unintentionally.  Pager: [phone] If no answer or after hours call [phone]   Called both daughters several times no answer. LM on Melinda's phone that no gross sign of bleeding.

## 2018-04-13 ENCOUNTER — Inpatient Hospital Stay (HOSPITAL_COMMUNITY): Payer: Medicare Other

## 2018-04-13 ENCOUNTER — Encounter (HOSPITAL_COMMUNITY): Payer: Self-pay | Admitting: Radiology

## 2018-04-13 DIAGNOSIS — Z66 Do not resuscitate: Secondary | ICD-10-CM

## 2018-04-13 DIAGNOSIS — T68XXXA Hypothermia, initial encounter: Secondary | ICD-10-CM

## 2018-04-13 DIAGNOSIS — Z515 Encounter for palliative care: Secondary | ICD-10-CM

## 2018-04-13 LAB — GLUCOSE, CAPILLARY
Glucose-Capillary: 175 mg/dL — ABNORMAL HIGH (ref 70–99)
Glucose-Capillary: 140 mg/dL — ABNORMAL HIGH (ref 70–99)
Glucose-Capillary: 100 mg/dL — ABNORMAL HIGH (ref 70–99)

## 2018-04-13 LAB — COMPREHENSIVE METABOLIC PANEL
ALT: 320 U/L — ABNORMAL HIGH (ref 0–44)
AST: 270 U/L — ABNORMAL HIGH (ref 15–41)
Albumin: 2 g/dL — ABNORMAL LOW (ref 3.5–5.0)
Alkaline Phosphatase: 128 U/L — ABNORMAL HIGH (ref 38–126)
Anion gap: 8 (ref 5–15)
BUN: 40 mg/dL — ABNORMAL HIGH (ref 8–23)
CO2: 20 mmol/L — ABNORMAL LOW (ref 22–32)
Calcium: 8 mg/dL — ABNORMAL LOW (ref 8.9–10.3)
Chloride: 109 mmol/L (ref 98–111)
Creatinine, Ser: 0.61 mg/dL (ref 0.44–1.00)
GFR calc Af Amer: >60 mL/min (ref >60)
GFR calc non Af Amer: >60 mL/min (ref >60)
Glucose, Bld: 167 mg/dL — ABNORMAL HIGH (ref 70–99)
Potassium: 4.1 mmol/L (ref 3.5–5.1)
Sodium: 137 mmol/L (ref 135–145)
Total Bilirubin: 0.7 mg/dL (ref 0.3–1.2)
Total Protein: 4 g/dL — ABNORMAL LOW (ref 6.5–8.1)

## 2018-04-13 LAB — BPAM RBC
Blood Product Expiration Date: 202004212359
Blood Product Expiration Date: 202004222359
ISSUE DATE / TIME: 202004070215
ISSUE DATE / TIME: 202004080807
Unit Type and Rh: 5100
Unit Type and Rh: 5100

## 2018-04-13 LAB — TYPE AND SCREEN
ABO/RH(D): O POS
Antibody Screen: NEGATIVE
Unit division: 0
Unit division: 0

## 2018-04-13 LAB — CBC
HCT: 29 % — ABNORMAL LOW (ref 36.0–46.0)
Hemoglobin: 9.2 g/dL — ABNORMAL LOW (ref 12.0–15.0)
MCH: 29.4 pg (ref 26.0–34.0)
MCHC: 31.7 g/dL (ref 30.0–36.0)
MCV: 92.7 fL (ref 80.0–100.0)
Platelets: 34 10*3/uL — ABNORMAL LOW (ref 150–400)
RBC: 3.13 MIL/uL — ABNORMAL LOW (ref 3.87–5.11)
RDW: 17.2 % — ABNORMAL HIGH (ref 11.5–15.5)
WBC: 4.3 10*3/uL (ref 4.0–10.5)
nRBC: 1.4 % — ABNORMAL HIGH (ref 0.0–0.2)

## 2018-04-13 LAB — PROTIME-INR
INR: 1.1 (ref 0.8–1.2)
Prothrombin Time: 14.5 seconds (ref 11.4–15.2)

## 2018-04-13 MED ORDER — HYDROCORTISONE NA SUCCINATE PF 100 MG IJ SOLR
50.0000 mg | Freq: Three times a day (TID) | INTRAMUSCULAR | Status: DC
  Administered 2018-04-13 (×2): 50 mg via INTRAVENOUS
  Filled 2018-04-13 (×2): qty 2

## 2018-04-13 MED ORDER — GLYCOPYRROLATE 0.2 MG/ML IJ SOLN
0.2000 mg | INTRAMUSCULAR | Status: DC | PRN
  Filled 2018-04-13: qty 1

## 2018-04-13 MED ORDER — BIOTENE DRY MOUTH MT LIQD: 15.0000 mL | OROMUCOSAL | Status: DC | PRN

## 2018-04-13 MED ORDER — MORPHINE SULFATE (PF) 2 MG/ML IV SOLN
2.0000 mg | Freq: Once | INTRAVENOUS | Status: AC
  Administered 2018-04-13: 2 mg via INTRAVENOUS
  Filled 2018-04-13: qty 1

## 2018-04-13 MED ORDER — HALOPERIDOL 0.5 MG PO TABS
0.5000 mg | ORAL_TABLET | ORAL | Status: DC | PRN
  Filled 2018-04-13: qty 1

## 2018-04-13 MED ORDER — GLYCOPYRROLATE 1 MG PO TABS
1.0000 mg | ORAL_TABLET | ORAL | Status: DC | PRN
  Filled 2018-04-13: qty 1

## 2018-04-13 MED ORDER — IOHEXOL 300 MG/ML  SOLN: 30.0000 mL | Freq: Once | INTRAMUSCULAR | Status: DC | PRN

## 2018-04-13 MED ORDER — HALOPERIDOL LACTATE 5 MG/ML IJ SOLN: 0.5000 mg | INTRAMUSCULAR | Status: DC | PRN

## 2018-04-13 MED ORDER — HALOPERIDOL LACTATE 2 MG/ML PO CONC
0.5000 mg | ORAL | Status: DC | PRN
  Filled 2018-04-13: qty 0.3

## 2018-04-13 MED ORDER — IOHEXOL 300 MG/ML  SOLN
100.0000 mL | Freq: Once | INTRAMUSCULAR | Status: AC | PRN
  Administered 2018-04-13: 100 mL via INTRAVENOUS

## 2018-04-13 MED ORDER — MORPHINE SULFATE (PF) 2 MG/ML IV SOLN
2.0000 mg | INTRAVENOUS | Status: DC | PRN
  Administered 2018-04-13 – 2018-04-14 (×2): 2 mg via INTRAVENOUS
  Filled 2018-04-13 (×2): qty 1

## 2018-04-13 MED ORDER — LORAZEPAM 2 MG/ML IJ SOLN: 0.5000 mg | INTRAMUSCULAR | Status: DC | PRN

## 2018-04-13 MED ORDER — POLYVINYL ALCOHOL 1.4 % OP SOLN
1.0000 [drp] | Freq: Four times a day (QID) | OPHTHALMIC | Status: DC | PRN
  Filled 2018-04-13: qty 15

## 2018-04-13 MED ORDER — SODIUM CHLORIDE (PF) 0.9 % IJ SOLN
INTRAMUSCULAR | Status: AC
  Filled 2018-04-13: qty 50

## 2018-04-13 NOTE — Progress Notes (Signed)
Chaplain responding to consult from palliative care.   Provided pastoral presence and prayers at bedside at request of pt's daughter.   Burnis Kingfisher, MDiv, [facility]

## 2018-04-13 NOTE — Progress Notes (Signed)
Note Shift to COMFORT  Appreciate PMT team expertise and engagement  If declines further overnight, would allow natural death, and as is comfy right now, hold morphine anxiolytics unless absolutely needed  Pleas Koch, MD [facility] 5:09 PM

## 2018-04-13 NOTE — Care Management Important Message (Signed)
Important Message  Patient Details  Name: [name] MRN: [id number] Date of Birth: [birth year]   Medicare Important Message Given:  Yes    Caren Macadam 04/13/2018, 11:27 AMImportant Message  Patient Details  Name: [name] MRN: [id number] Date of Birth: 1932-04-30   Medicare Important Message Given:  Yes    Caren Macadam 04/13/2018, 11:27 AM

## 2018-04-13 NOTE — Progress Notes (Addendum)
PROGRESS NOTE  [name] TXH:[id number] DOB: 04-04-1932 DOA: 04/10/2018 PCP: Gordan Payment., MD   LOS: 3 days   Brief narrative:   85 y.o. female A. fib on Eliquis, hypertension, hyperlipidemia, diabetes mellitus, coronary artery disease, chronic diastolic congestive heart failure, ascending aortic aneurysm, chronic anemia, history of temporal arteritis, osteoporosis, history of rectovaginal fistula and osteoarthritis.  sent from SNF on 4/6 with generalized weakness, fatigue, increased oxygen requirement, hypotension and hemoglobin low at 7.3.     ER-melena, guaiac positive stool.   Hemoglobin was initially 6.8 and on recheck dropped to 6.1.    transfused 1 unit of blood overnight and admitted for evaluation of GI bleeding.    Subjective:  Recurrence of sleepiness this am, with low cbg and poorly responsive state atthis time, however overall seems improved from prior Looks like was bair hugged again, given d5   Assessment/Plan:  Principal Problem:   Symptomatic anemia Active Problems:   Hyponatremia   Hyperkalemia   Chronic a-fib   HTN (hypertension)   Gastrointestinal hemorrhage   Bradycardia, sinus   Hypotension   Lethargy   Hypothermia  Acute blood loss anemia/GI bleeding -Hematochezia and setting Eliquis intake. -Transfused next one 4/6, blood count still low== blood count still low and confirmed at 6.5 transfusing another unit of blood--hemoglobin up to 9 right now - Eliquis on hold [tcp to 30's] -GI consulted, IV PPI GTT restarting [IV access low] -Clear liquid diet held.  Holding anticoagulation, probably indefinitely - Continue iron supplementation  ?Aspiration vs Intra-abd infeciton  Has a h/o recto-vaginal fistula in 2015  1 vw CXR not suggestive--she is lethargic and has crackles on lung on L decubitus Cont IV abx Search for other causes of fever--get  Ct abd NOW  Cardiovascular issues : Hypertension/ hyperlipidemia/ chronic A. Fib/ chronic diastolic  heart failure/ coronary artery disease/ascending aortic aneurysm - Has distributive/hypovolemic shock-like features with low temperature and low blood pressure--see above - PO cardizem, eliquis,BB  Torsemide no all on hold- Continue statin  Type 2 diabetes mellitus - ssi, but d/c HS coverage as low cbg am.   - Glimepiride on hold - Continue gabapentin  Hypervolemic hyponatremia:   - Sodium level acutely low at 129 at presentation.   - pta torsemide 40-stop normal saline now-gfollow CXR -exam suggest mild Vol ?  ? torsemide once blood pressure improves.    Hyperkalemia:  - Potassium level was elevated to 5.4.  Now down -hold torsemide  History of temporal arteritis -Noted 1 of the previous documentations.  Patient is on prednisone 20 mg 2 times daily.  Not sure if this is for temporal arteritis or COPD. Resume the same.  -start cortef 4/9 given hemodyn variability  History of insomnia - NO ANXIOLYTIC    Dementia  - fuctuant--Unable to clarify details of her baseline mobility and mental status.  Body mass index is 19.65 kg/m. Diet: Clear liquid diet DVT prophylaxis:  SCDs Code Status:   Code Status: Partial Code  Family Communication:  long discussion with daughter on phone x 2 on 4/9---will update Disposition Plan:  Back to SNF after GI intervention  Consultants:  GI  Procedures:  Not yet  Antimicrobials:  Anti-infectives (From admission, onward)   Start     Dose/Rate Route Frequency Ordered Stop   04/12/18 1600  piperacillin-tazobactam (ZOSYN) IVPB 3.375 g     3.375 g 12.5 mL/hr over 240 Minutes Intravenous Every 8 hours 04/12/18 1020     04/12/18 1030  piperacillin-tazobactam (ZOSYN) IVPB  3.375 g     3.375 g 100 mL/hr over 30 Minutes Intravenous STAT 04/12/18 1020 04/12/18 1111   04/11/18 1230  valACYclovir (VALTREX) tablet 500 mg     500 mg Oral 2 times daily 04/11/18 1226        Infusions:  . dextrose 5 % and 0.9% NaCl 75 mL/hr at 04/13/18 0500  .  pantoprozole (PROTONIX) infusion 8 mg/hr (04/13/18 0500)  . piperacillin-tazobactam (ZOSYN)  IV 3.375 g (04/13/18 0744)    Scheduled Meds: . atorvastatin  10 mg Oral QPM  . calcitonin (salmon)  1 spray Alternating Nares Daily  . chlorhexidine  15 mL Mouth Rinse BID  . escitalopram  5 mg Oral Daily  . ferrous sulfate  325 mg Oral QODAY  . insulin aspart  0-5 Units Subcutaneous QHS  . insulin aspart  0-9 Units Subcutaneous TID WC  . mouth rinse  15 mL Mouth Rinse q12n4p  . predniSONE  20 mg Oral BID WC  . senna  1 tablet Oral QHS  . valACYclovir  500 mg Oral BID    PRN meds: acetaminophen, ondansetron **OR** ondansetron (ZOFRAN) IV   Objective: Vitals:   04/13/18 0601 04/13/18 0949  BP: (!) 115/57 97/60  Pulse: 62 66  Resp: 13 14  Temp: 98.5 F (36.9 C) 98.6 F (37 C)  SpO2: 94% 98%    Intake/Output Summary (Last 24 hours) at 04/13/2018 0952 Last data filed at 04/13/2018 0500 Gross per 24 hour  Intake 2119.18 ml  Output 220 ml  Net 1899.18 ml   Filed Weights   04/10/18 2137  Weight: 47.2 kg   Weight change:  Body mass index is 19.65 kg/m.   Physical Exam: eomi ncat GCS ~ 13 Dry mucosa s1 s 2no m/r/g abd soft DIFFUSELY TENDER ALL QUADRANTS Neuro canot assess LE swelling bilaterally and in ankles Moaning and groaning   Data Review: I have personally reviewed the laboratory data and studies available.  Recent Labs  Lab 04/10/18 2207  04/11/18 0803 04/12/18 0119 04/12/18 0504 04/12/18 0624 04/13/18 0446  WBC 6.9   < > 6.1 5.4 8.0 8.3 4.3  NEUTROABS 5.6  --  4.7 4.6  --  6.9  --   HGB 6.8*   < > 7.5* 10.3* 6.4* 6.5* 9.2*  HCT 22.2*   < > 24.4* 34.4* 20.4* 21.0* 29.0*  MCV 93.3   < > 92.8 93.7 91.9 92.9 92.7  PLT 53*   < > 46* 32* 38* 40* 34*   < > = values in this interval not displayed.   Recent Labs  Lab 04/10/18 2207 04/10/18 2220 04/12/18 0119 04/12/18 0504 04/13/18 0446  NA 131* 129* 135 135 137  K 5.4* 5.6* 6.4* 5.1 4.1  CL 93*  --  105  107 109  CO2 30  --  21* 21* 20*  GLUCOSE 123*  --  233* 233* 167*  BUN 60*  --  48* 51* 40*  CREATININE 0.87  --  0.76 0.78 0.61  CALCIUM 8.4*  --  7.6* 8.0* 8.0*    Rhetta MuraJai-Gurmukh Reylene Stauder, MD  [facility] 04/13/2018

## 2018-04-13 NOTE — Progress Notes (Signed)
Pt unable to drink oral contrast for CT scan MD notified.

## 2018-04-13 NOTE — Progress Notes (Signed)
PT Cancellation Note  Patient Details Name: [name] MRN: [id number] DOB: 16-Feb-1932   Cancelled Treatment:     PT order received but eval deferred this am - per SLP and RN, pt too lethargic to participate.  Will follow  Mauro Kaufmann PT Acute Rehabilitation Services Pager [phone] Office [phone]    [facility] 04/13/2018, 12:36 PM

## 2018-04-13 NOTE — Progress Notes (Signed)
[facility] PROGRESS NOTE Subjective Patient was started on antibiotics yesterday for possible sepsis.  No gross GI bleeding.During the night it appears that she became hypothermic and desaturated to 81% on 4 L.  Her oxygen was increased to 10 L with some improvement.  She has been doing better since that time.  Objective: Vital signs in last 24 hours: Temp:  [93.8 F (34.3 C)-98.5 F (36.9 C)] 98.5 F (36.9 C) (04/09 0601) Pulse Rate:  [52-62] 62 (04/09 0601) Resp:  [10-20] 13 (04/09 0601) BP: (95-140)/(53-62) 115/57 (04/09 0601) SpO2:  [88 %-100 %] 94 % (04/09 0601) Last BM Date: 04/12/18  Intake/Output from previous day: 04/08 0701 - 04/09 0700 In: 2369.2 [I.V.:1921.2; Blood:350; IV Piggyback:98] Out: 420 [Urine:420] Intake/Output this shift: No intake/output data recorded.  PE: General--patient still sleepy but much more alert than when I saw her yesterday.  She does respond somewhat to questions Heart--tachycardic Lungs--grossly clear Abdomen--soft and nontender  Lab Results: Recent Labs    04/11/18 0803 04/12/18 0119 04/12/18 0504 04/12/18 0624 04/13/18 0446  WBC 6.1 5.4 8.0 8.3 4.3  HGB 7.5* 10.3* 6.4* 6.5* 9.2*  HCT 24.4* 34.4* 20.4* 21.0* 29.0*  PLT 46* 32* 38* 40* 34*   BMET Recent Labs    04/10/18 2207 04/10/18 2220 04/12/18 0119 04/12/18 0504 04/13/18 0446  NA 131* 129* 135 135 137  K 5.4* 5.6* 6.4* 5.1 4.1  CL 93*  --  105 107 109  CO2 30  --  21* 21* 20*  CREATININE 0.87  --  0.76 0.78 0.61   LFT Recent Labs    04/10/18 2207 04/12/18 0504 04/13/18 0446  PROT 5.0* 4.1* 4.0*  AST 136* 377* 270*  ALT 126* 357* 320*  ALKPHOS 170* 137* 128*  BILITOT 0.4 0.5 0.7   PT/INR Recent Labs    04/10/18 2207 04/13/18 0446  LABPROT 14.0 14.5  INR 1.1 1.1   PANCREAS No results for input(s): LIPASE in the last 72 hours.       Studies/Results: Dg Chest Port 1 View  Result Date: 04/12/2018 CLINICAL DATA:  Pneumonia EXAM:  PORTABLE CHEST 1 VIEW COMPARISON:  04/10/2018 FINDINGS: Cardiomegaly. Small left pleural effusion. Left lower lobe opacity, likely atelectasis. Mild vascular congestion. Soft tissue density projects over the right upper lobe which appears to be external to the patient. Right hilar prominence is stable, likely related to pulmonary artery. Mild hyperinflation. IMPRESSION: Cardiomegaly. Prominent right hilum, likely related to enlarged pulmonary artery and possible pulmonary arterial hypertension. Soft tissue density projects over the right upper lobe which appears to be external to the patient. Small left effusion.  Left lower lobe atelectasis. Electronically Signed   By: Charlett NoseKevin  Dover M.D.   On: 04/12/2018 09:37    Medications: I have reviewed the patient's current medications.  Assessment:   1.  GI bleed.  No gross signs of GI bleeding.  Stool was brown yesterday.  Hemoglobin is up to 9 after transfusion. 2.  Hypotension/altered mental status.  Because of this is not currently clear but she does seem to be better with antibiotic.  Her blood pressure is better this morning.  She did have a hypoxic spell during the night.   Plan: 1.  Would continue off of Eliquis now.  There does not seem to be any ongoing bleeding.  We will follow 1 or 2 more days in case of bleeding resumes. 2.  Continue to evaluate for other causes of her altered mental status and hypotension.   Llana AlimentJames L  Maribell Demeo 04/13/2018, 8:47 AM  This note was created using voice recognition software. Minor errors may Have occurred unintentionally.  Pager: [phone] If no answer or after hours call [phone]

## 2018-04-13 NOTE — Evaluation (Signed)
Clinical/Bedside Swallow Evaluation Patient Details  Name: [name] MRN: [id number] Date of Birth: 08/20/1932  Today's Date: 04/13/2018 Time: SLP Start Time (ACUTE ONLY): 1030 SLP Stop Time (ACUTE ONLY): 1045 SLP Time Calculation (min) (ACUTE ONLY): 15 min  Past Medical History:  Past Medical History:  Diagnosis Date  . A-fib (HCC)   . Arthritis    HIPS  . Full dentures   . Hyperlipidemia   . Hypertension   . Osteoporosis   . Rectovaginal fistula   . Renal cyst    Past Surgical History:  Past Surgical History:  Procedure Laterality Date  . CATARACT EXTRACTION W/ INTRAOCULAR LENS  IMPLANT, BILATERAL  2015  . CHOLECYSTECTOMY  2007  . COLONOSCOPY  2010  . EXCISION BENIGN LEFT BREAST CYST  YRS AGO   HPI:  85 y.o.femaleA. fib on Eliquis, hypertension, hyperlipidemia, diabetes mellitus, coronary artery disease, chronic diastolic congestive heart failure, ascending aortic aneurysm, chronic anemia, history of temporal arteritis, osteoporosis, history of rectovaginal fistula and osteoarthritis.  Sent from SNF on 4/6 with generalized weakness, fatigue, increased oxygen requirement, hypotension and hemoglobin low at 7.3.  Dx GI bleed - GI following.  W/u pending.   Assessment / Plan / Recommendation Clinical Impression  Pt sleepy, arousable and states name, but otherwise unable to maintain alertness for adequate participation in swallow evaluation.  Oral care provided; dried, bloody secretions removed from upper dentures and tongue.  No effort on part of patient; no awareness of ice chips/tspn water; no spontaneous swallow elicited.  SLP will follow for PO readiness and improved participation; hold POs unless pt is sufficiently alert.  D/W MD, RN.   SLP Visit Diagnosis: Dysphagia, unspecified (R13.10)    Aspiration Risk       Diet Recommendation   continue clear liquids when alert  Medication Administration: Crushed with puree    Other  Recommendations Oral Care  Recommendations: Oral care BID   Follow up Recommendations        Frequency and Duration min 2x/week  1 week       Prognosis Prognosis for Safe Diet Advancement: Good      Swallow Study   General Date of Onset: 04/13/18 HPI: 85 y.o.femaleA. fib on Eliquis, hypertension, hyperlipidemia, diabetes mellitus, coronary artery disease, chronic diastolic congestive heart failure, ascending aortic aneurysm, chronic anemia, history of temporal arteritis, osteoporosis, history of rectovaginal fistula and osteoarthritis.  Sent from SNF on 4/6 with generalized weakness, fatigue, increased oxygen requirement, hypotension and hemoglobin low at 7.3.  Dx GI bleed - GI following.  W/u pending. Respiratory Status: Nasal cannula History of Recent Intubation: No Behavior/Cognition: Lethargic/Drowsy Oral Care Completed by SLP: Yes Oral Cavity - Dentition: Dentures, bottom;Dentures, top Baseline Vocal Quality: Normal Volitional Cough: Cognitively unable to elicit Volitional Swallow: Unable to elicit    Oral/Motor/Sensory Function Overall Oral Motor/Sensory Function: Within functional limits   Ice Chips Ice chips: Impaired Oral Phase Impairments: Poor awareness of bolus   Thin Liquid Thin Liquid: Impaired Presentation: Spoon Oral Phase Impairments: Poor awareness of bolus    Nectar Thick Nectar Thick Liquid: Not tested   Honey Thick Honey Thick Liquid: Not tested   Puree Puree: Not tested   Solid     Solid: Not tested      [name] [name] 04/13/2018,11:22 AM   [name] L. Samson Frederic, MA CCC/SLP Acute Rehabilitation Services Office number [phone] Pager [phone]

## 2018-04-13 NOTE — Consult Note (Addendum)
Consultation Note Date: 04/13/2018   Patient Name: [name]  DOB: 07/04/1932  MRN: [id number]  Age / Sex: 85 y.o., female  PCP: Caitlin PaymentGrisso, Greg A., MD Referring Physician: Rhetta MuraSamtani, Jai-Gurmukh, MD  Reason for Consultation: Establishing goals of care and Psychosocial/spiritual support  HPI/Patient Profile: 85 y.o. female  with past medical history of CAD, Afib, CHF (no echo in Epic), DM, AAA, temporal arteritis, rectovaginal fistula s/p surgical repair, who was admitted on 04/10/2018 from SNF with weakness, fatigue and a hgb of 7.3.  She was also noted to have hypoxia with an increased oxygen requirement.  [name] received a blood transfusion and she has been followed by Gastroenterology.  An endoscopy was planned but then cancelled as the patient had a event overnight (4/8-4/9) that included hypothermia (93.8 rectal), hypoglycemia, hypoxia, and bradycardia (dipping into the 20s and 30s).   Today (4/9) the patient is lethargic, with elevated LFTs, platlets of 34 and albumin of 2.0.  She is on 10L of oxygen.  Clinical Assessment and Goals of Care:  I have reviewed medical records including EPIC notes, labs and imaging, received report from Dr. Mahala MenghiniSamtani, assessed the patient and then utilized face Time on my phone to speak with [name] and allow her to see her mother.  We discussed prognosis, GOC, EOL wishes, disposition and options.  I introduced Palliative Medicine as specialized medical care for people living with serious illness. It focuses on providing relief from the symptoms and stress of a serious illness. The goal is to improve quality of life for both the patient and the family.  We discussed her current illness and what it means in the larger context of her on-going co-morbidities.  Natural disease trajectory and expectations at EOL were discussed.  Specifically we reviewed her bleeding, decreased  appetite, decreased mobility.  She has had a steep decline over the last two months.     I attempted to elicit values and goals of care important to the patient.  [name] became emotional on the phone and said "I don't want her put thru anymore, I don't want her to suffer."   The difference between aggressive medical intervention and comfort care was considered in light of the patient's goals of care. We talked about shifting goals to comfort and allowing her to pass with dignity and grace.  In case she stabilized  - we also talked [facility].  [name] stated that the family would [facility] if that was possible.   Questions and concerns were addressed. The family was encouraged to call with questions or concerns.     Primary Decision Maker:  NEXT OF KIN daughter [name]    SUMMARY OF RECOMMENDATIONS    Death is imminent.  Overnight patient had a temp of 93.8 rectal, with hypotension, hypoglycemia, bradycardia, requiring 10L of oxygen.  She would have died if she was comfort measures last night.  Recommend Family be allowed to visit ASAP.  Shift to full  comfort.  Would not decrease oxygen or DC D5 until family is ready to say goodbye as death will be imminent after these measures are discontinued.  In case patient unexpectedly stabilizes I will consult social work for placement at [facility].  Code Status/Advance Care Planning:  DNR   Symptom Management:   Will add comfort PRNs.  Including morphine, ativan, and robinul.  Additional Recommendations (Limitations, Scope, Preferences):  Full Comfort Care  Palliative Prophylaxis:   Frequent Pain Assessment  Psycho-social/Spiritual:   Desire for further Chaplaincy support:  Requested.  Prognosis:  Hours   Discharge Planning: Anticipated Hospital Death      Primary Diagnoses: Present on Admission: . Symptomatic anemia . Hyponatremia .  Hyperkalemia . Chronic a-fib . HTN (hypertension)   I have reviewed the medical record, interviewed the patient and family, and examined the patient. The following aspects are pertinent.  Past Medical History:  Diagnosis Date  . A-fib (HCC)   . Arthritis    HIPS  . Full dentures   . Hyperlipidemia   . Hypertension   . Osteoporosis   . Rectovaginal fistula   . Renal cyst    Social History   Socioeconomic History  . Marital status: Widowed    Spouse name: Not on file  . Number of children: Not on file  . Years of education: Not on file  . Highest education level: Not on file  Occupational History  . Not on file  Social Needs  . Financial resource strain: Not on file  . Food insecurity:    Worry: Not on file    Inability: Not on file  . Transportation needs:    Medical: Not on file    Non-medical: Not on file  Tobacco Use  . Smoking status: Current Every Day Smoker    Packs/day: 0.30    Years: 10.00    Pack years: 3.00    Types: Cigarettes  . Smokeless tobacco: Never Used  Substance and Sexual Activity  . Alcohol use: No  . Drug use: No  . Sexual activity: Not on file  Lifestyle  . Physical activity:    Days per week: Not on file    Minutes per session: Not on file  . Stress: Not on file  Relationships  . Social connections:    Talks on phone: Not on file    Gets together: Not on file    Attends religious service: Not on file    Active member of club or organization: Not on file    Attends meetings of clubs or organizations: Not on file    Relationship status: Not on file  Other Topics Concern  . Not on file  Social History Narrative  . Not on file   No family history on file. Scheduled Meds: . calcitonin (salmon)  1 spray Alternating Nares Daily  . chlorhexidine  15 mL Mouth Rinse BID  . ferrous sulfate  325 mg Oral QODAY  . hydrocortisone sod succinate (SOLU-CORTEF) inj  50 mg Intravenous TID  . insulin aspart  0-9 Units Subcutaneous TID WC  .  mouth rinse  15 mL Mouth Rinse q12n4p  . senna  1 tablet Oral QHS  . sodium chloride (PF)      . valACYclovir  500 mg Oral BID   Continuous Infusions: . dextrose 5 % and 0.9% NaCl 30 mL/hr at 04/13/18 1258  . pantoprozole (PROTONIX) infusion 8 mg/hr (04/13/18 1137)  . piperacillin-tazobactam (ZOSYN)  IV 3.375 g (04/13/18 0744)  PRN Meds:.acetaminophen, iohexol, ondansetron **OR** ondansetron (ZOFRAN) IV Allergies  Allergen Reactions  . Fosamax [Alendronate Sodium] Other (See Comments)    Gi upset  . Hydrocodone-Homatropine Other (See Comments)    Unknown  . Losartan Potassium Other (See Comments)    Unknown  . Oxycodone Other (See Comments)    HALLUCINATES   Review of Systems patient not speaking.  Physical Exam  Elderly frail female, lethargic, with stir to loud voice but falls quickly back to sleep. Dried blood in left eye, left nostril and on lower lip CV rrr resp no distress on 10L Abdomen soft, nt, nd Left foot with large bruise on the dorsal area.  Vital Signs: BP (!) 101/52 (BP Location: Left Wrist)   Pulse (!) 59   Temp 97.9 F (36.6 C) (Oral)   Resp 18   Ht  (1.549 m)   Wt 47.2 kg   SpO2 96%   BMI 19.65 kg/m  Pain Scale: PAINAD POSS *See Group Information*: 1-Acceptable,Awake and alert Pain Score: Asleep   SpO2: SpO2: 96 % O2 Device:SpO2: 96 % O2 Flow Rate: .O2 Flow Rate (L/min): 5 L/min  IO: Intake/output summary:   Intake/Output Summary (Last 24 hours) at 04/13/2018 1323 Last data filed at 04/13/2018 1118 Gross per 24 hour  Intake 2157.88 ml  Output 270 ml  Net 1887.88 ml    LBM: Last BM Date: 04/12/18 Baseline Weight: Weight: 47.2 kg Most recent weight: Weight: 47.2 kg     Palliative Assessment/Data: 10%     Time In: 3:00 Time Out: 4:10 Time Total: 70 min. Greater than 50%  of this time was spent counseling and coordinating care related to the above assessment and plan.  Signed by: Norvel Richards, PA-C Palliative Medicine  Pager: [phone]  Please contact Palliative Medicine Team phone at [phone] for questions and concerns.  For individual provider: See Loretha Stapler

## 2018-04-14 DIAGNOSIS — Z515 Encounter for palliative care: Secondary | ICD-10-CM

## 2018-04-14 MED ORDER — LORAZEPAM 2 MG/ML IJ SOLN
0.5000 mg | Freq: Once | INTRAMUSCULAR | Status: AC
  Administered 2018-04-14: 0.5 mg via INTRAVENOUS
  Filled 2018-04-14: qty 1

## 2018-04-14 MED ORDER — MORPHINE SULFATE (PF) 2 MG/ML IV SOLN
2.0000 mg | INTRAVENOUS | Status: DC
  Administered 2018-04-14: 2 mg via INTRAVENOUS
  Filled 2018-04-14: qty 1

## 2018-04-14 MED ORDER — LORAZEPAM 2 MG/ML IJ SOLN: 0.5000 mg | Freq: Once | INTRAMUSCULAR | Status: DC

## 2018-04-14 MED ORDER — GLYCOPYRROLATE 0.2 MG/ML IJ SOLN
0.2000 mg | Freq: Three times a day (TID) | INTRAMUSCULAR | Status: DC
  Administered 2018-04-14: 0.2 mg via INTRAVENOUS
  Filled 2018-04-14 (×2): qty 1

## 2018-04-14 MED ORDER — LORAZEPAM 2 MG/ML IJ SOLN: 0.2500 mg | Freq: Two times a day (BID) | INTRAMUSCULAR | Status: DC

## 2018-04-14 NOTE — Discharge Summary (Signed)
Physician Discharge Summary  Jamison NeighborBonnie P Collet ZOX:[id number] DOB: 05/18/1932 DOA: 04/10/2018  PCP: Gordan PaymentGrisso, Greg A., MD  Admit date: 04/10/2018 Discharge date: 04/14/2018  Time spent: 32 minutes  Recommendations for Outpatient Follow-up:  1. Going to [facility] for [facility] care 2. recommend EOL measures [secretion management, Opiates and anxiolytics as per Hospice MD at facility]  Discharge Diagnoses:  Principal Problem:   Symptomatic anemia Active Problems:   Hyponatremia   Hyperkalemia   Chronic a-fib   HTN (hypertension)   Gastrointestinal hemorrhage   Bradycardia, sinus   Hypotension   Lethargy   Hypothermia   Comfort measures only status   DNR (do not resuscitate)   Palliative care encounter   Discharge Condition: gaurded  Diet recommendation: comfort  Filed Weights   04/10/18 2137  Weight: 47.2 kg    History of present illness:  85 y.o.femaleA. fib on Eliquis, hypertension, hyperlipidemia, diabetes mellitus, coronary artery disease, chronic diastolic congestive heart failure, ascending aortic aneurysm, chronic anemia, history of temporal arteritis, osteoporosis, history of rectovaginal fistula and osteoarthritis.  sent from SNF on 4/6 with generalized weakness, fatigue, increased oxygen requirement, hypotension and hemoglobin low at 7.3.    ER-melena, guaiac positive stool.   Hemoglobin was initially 6.8 and on recheck dropped to 6.1.    transfused 1 unit of blood overnight and admitted for evaluation of GI bleeding.    Hospital Course:  Acute blood loss anemia/GI bleeding -Hematochezia and setting Eliquis intake. -Transfused x 2 ?Aspiration vs Intra-abd infeciton             Has a h/o recto-vaginal fistula in 2015  1 vw CXR not suggestive--she is lethargic and has crackles on lung on L decubitus Cont IV abx Search for other causes of fever--get  Ct abd NOW Cardiovascular issues : Hypertension/ hyperlipidemia/ chronic A. Fib/ chronic diastolic heart  failure/ coronary artery disease/ascending aortic aneurysm - Has distributive/hypovolemic shock-like features with low temperature and low blood pressure--see abovre Type 2 diabetes mellitus - ssi,earlier-now comofrt trajectory Hypervolemic hyponatremia:  Hyperkalemia:  - Potassium level was elevated no furthe rmanagement History of temporal arteritis -Noted 1 of the previous documentations.stopped steroids History of insomnia -leaving and going on comfort measures and will need meds  Dementia  - fluctuant--Unable to clarify details of her baseline mobility and mental status.    Consultations:  GI   Palliative  Discharge Exam: Vitals:   04/14/18 0553 04/14/18 0933  BP:    Pulse:    Resp: 10 12  Temp:    SpO2: 94%     General: awake alert minimally coherent in nad Cardiovascular: s1 s2 no m/r/g Respiratory: clear no rales no rhonchi Neuro intact   Discharge Instructions   Discharge Instructions    Diet - low sodium heart healthy   Complete by:  As directed    Increase activity slowly   Complete by:  As directed      Allergies as of 04/14/2018      Reactions   Fosamax [alendronate Sodium] Other (See Comments)   Gi upset   Hydrocodone-homatropine Other (See Comments)   Unknown   Losartan Potassium Other (See Comments)   Unknown   Oxycodone Other (See Comments)   HALLUCINATES      Medication List    STOP taking these medications   atorvastatin 10 MG tablet Commonly known as:  LIPITOR   calcitonin (salmon) 200 UNIT/ACT nasal spray Commonly known as:  MIACALCIN/FORTICAL   carvedilol 25 MG tablet Commonly known as:  COREG  cholecalciferol 25 MCG (1000 UT) tablet Commonly known as:  VITAMIN D3   dextrose 40 % Gel Commonly known as:  GLUTOSE   diltiazem 180 MG 24 hr capsule Commonly known as:  DILACOR XR   Eliquis 2.5 MG Tabs tablet Generic drug:  apixaban   escitalopram 5 MG tablet Commonly known as:  LEXAPRO   ferrous sulfate 325 (65  FE) MG tablet   fluconazole 150 MG tablet Commonly known as:  DIFLUCAN   gabapentin 300 MG capsule Commonly known as:  NEURONTIN   glimepiride 2 MG tablet Commonly known as:  AMARYL   insulin glargine 100 UNIT/ML injection Commonly known as:  LANTUS   insulin regular 100 units/mL injection Commonly known as:  NOVOLIN R,HUMULIN R   magnesium hydroxide 400 MG/5ML suspension Commonly known as:  MILK OF MAGNESIA   potassium chloride SA 20 MEQ tablet Commonly known as:  K-DUR,KLOR-CON   predniSONE 20 MG tablet Commonly known as:  DELTASONE   senna 8.6 MG Tabs tablet Commonly known as:  SENOKOT   torsemide 20 MG tablet Commonly known as:  DEMADEX   valACYclovir 500 MG tablet Commonly known as:  VALTREX   zolpidem 10 MG tablet Commonly known as:  AMBIEN     TAKE these medications   acetaminophen 325 MG tablet Commonly known as:  TYLENOL Take 650 mg by mouth every 6 (six) hours as needed for moderate pain or fever.      Allergies  Allergen Reactions  . Fosamax [Alendronate Sodium] Other (See Comments)    Gi upset  . Hydrocodone-Homatropine Other (See Comments)    Unknown  . Losartan Potassium Other (See Comments)    Unknown  . Oxycodone Other (See Comments)    HALLUCINATES      The results of significant diagnostics from this hospitalization (including imaging, microbiology, ancillary and laboratory) are listed below for reference.    Significant Diagnostic Studies: Ct Head Wo Contrast  Result Date: 04/10/2018 CLINICAL DATA:  Altered level of consciousness. Weakness and lethargy. EXAM: CT HEAD WITHOUT CONTRAST TECHNIQUE: Contiguous axial images were obtained from the base of the skull through the vertex without intravenous contrast. COMPARISON:  Head CT 11/09/2017 FINDINGS: Brain: No intracranial hemorrhage, mass effect, or midline shift. Brain volume is normal for age. No hydrocephalus. The basilar cisterns are patent. No evidence of territorial infarct or  acute ischemia. No extra-axial or intracranial fluid collection. Vascular: Atherosclerosis of skullbase vasculature without hyperdense vessel or abnormal calcification. Skull: No fracture or focal lesion. Sinuses/Orbits: No acute findings. Bilateral cataract resection. Other: Multiple small metallic densities in the left temporal scalp in just lateral to the superior left orbit are new from prior exam, no associated air. IMPRESSION: 1. No acute intracranial abnormality. 2. Punctate metallic densities in the left frontal scalp soft tissue are new from November 2019 but do not appear acute. Electronically Signed   By: Narda Rutherford M.D.   On: 04/10/2018 22:46   Ct Abdomen Pelvis W Contrast  Result Date: 04/13/2018 CLINICAL DATA:  85 y.o. female A. fib on Eliquis, hypertension, hyperlipidemia, diabetes mellitus, coronary artery disease, chronic diastolic congestive heart failure, ascending aortic aneurysm, chronic anemia, history of temporal arteritis, osteoporosis, history of rectovaginal fistula and osteoarthritis. sent from SNF on 4/6 with generalized weakness, fatigue, increased oxygen requirement, hypotension and hemoglobin low at 7.3. EXAM: CT ABDOMEN AND PELVIS WITH CONTRAST TECHNIQUE: Multidetector CT imaging of the abdomen and pelvis was performed using the standard protocol following bolus administration of intravenous contrast. CONTRAST:  OMNIPAQUE IOHEXOL 300 MG/ML  SOLN COMPARISON:  Prior CTs, most recent 02/19/2018 FINDINGS: Lower chest: Right infrahilar mass, incompletely imaged, measuring 2.9 x 2.6 cm transversely. Small bilateral pleural effusions. There is dependent opacity most evident in the lower lobes, consistent with atelectasis. Small ill-defined nodule in the left upper lobe lingula measuring 7 mm. Hepatobiliary: Liver is unremarkable. Status post cholecystectomy. Common bile duct measures 11 mm with distal tapering. This is chronic. Pancreas: Unremarkable. No pancreatic ductal  dilatation or surrounding inflammatory changes. Spleen: Normal in size without focal abnormality. Adrenals/Urinary Tract: No adrenal masses. Bilateral renal cortical thinning. Kidneys normal in position and orientation. 11 mm cyst, midpole of the right kidney. 17 mm cyst, lower pole of the left kidney. Subcentimeter low-density lesion in the lateral lower pole the right kidney, too small to fully characterize but also likely a cyst. These findings are stable from the prior CT. Bilateral extrarenal pelves. No hydronephrosis. Ureters normal in course and in caliber. There is dependent density in the left posterior bladder, not present on prior CTs. This has the appearance of dependent debris. No convincing bladder mass. No wall thickening. Stomach/Bowel: Stomach is decompressed but otherwise unremarkable. Small bowel is normal in caliber. No wall thickening. No inflammation. There are numerous diverticula mostly along the sigmoid colon. No convincing diverticulitis or other colonic inflammatory process. Moderate increased colonic stool burden. Appendix not visualized.  No evidence of appendicitis. Vascular/Lymphatic: Aortic atherosclerosis. No aneurysm. No enlarged lymph nodes. Reproductive: Uterus and bilateral adnexa are unremarkable. Other: No ascites. No abdominal wall hernia. There is subcutaneous edema most evident in the lower abdomen, pelvis and visualized proximal thighs. Musculoskeletal: Moderate compression fracture of T9, not included on the field of view of the recent prior CTs. This appears chronic. Mild depression of the upper endplate of T11 new since the most recent prior CT. Mild compression fracture of L1, new from the prior CT. Chronic compression deformity of L2. No other fractures.  No bone lesions. IMPRESSION: 1. Incompletely imaged right infrahilar mass measuring 2.9 x 2.6 cm. This is concerning for malignancy. Follow-up chest CT with contrast on a nonemergent basis is recommended. 2. Small  bilateral pleural effusions with dependent lower lobe opacity consistent with atelectasis. No convincing pneumonia. 3. Mild compression fractures of T11 and L1, which appear new from the most recent prior CT. Chronic appearing compression fracture of T9. Chronic fracture of L2. 4. No acute findings within the abdomen or pelvis. 5. Moderate increased colonic stool burden. Extensive sigmoid colon diverticulosis. 6. Aortic atherosclerosis. Electronically Signed   By: Amie Portland M.D.   On: 04/13/2018 12:07   Dg Chest Port 1 View  Result Date: 04/12/2018 CLINICAL DATA:  Pneumonia EXAM: PORTABLE CHEST 1 VIEW COMPARISON:  04/10/2018 FINDINGS: Cardiomegaly. Small left pleural effusion. Left lower lobe opacity, likely atelectasis. Mild vascular congestion. Soft tissue density projects over the right upper lobe which appears to be external to the patient. Right hilar prominence is stable, likely related to pulmonary artery. Mild hyperinflation. IMPRESSION: Cardiomegaly. Prominent right hilum, likely related to enlarged pulmonary artery and possible pulmonary arterial hypertension. Soft tissue density projects over the right upper lobe which appears to be external to the patient. Small left effusion.  Left lower lobe atelectasis. Electronically Signed   By: Charlett Nose M.D.   On: 04/12/2018 09:37   Dg Chest Port 1 View  Result Date: 04/10/2018 CLINICAL DATA:  Shortness of breath. Lethargy. EXAM: PORTABLE CHEST 1 VIEW COMPARISON:  Radiograph 03/12/2018. Chest CT 02/19/2018 FINDINGS:  The patient is rotated. Cardiomegaly is unchanged from prior exam. Right perihilar opacities likely enlarged pulmonary arteries as seen on CT. Improved right basilar aeration from prior exam. Persistent blunting of the left costophrenic angle. Decreased vascular congestion. No pneumothorax. The bones are under mineralized. IMPRESSION: 1. Stable cardiomegaly. Right hilar prominence is likely enlarged pulmonary artery accentuated by  rotation. Decreased vascular congestion since last month. 2. Improved right basilar aeration from prior exam. Persistent blunting of left costophrenic angle, improved from prior and likely a small amount of pleural fluid. Electronically Signed   By: Narda Rutherford M.D.   On: 04/10/2018 22:36    Microbiology: No results found for this or any previous visit (from the past 240 hour(s)).   Labs: Basic Metabolic Panel: Recent Labs  Lab 04/10/18 2207 04/10/18 2220 04/12/18 0119 04/12/18 0504 04/13/18 0446  NA 131* 129* 135 135 137  K 5.4* 5.6* 6.4* 5.1 4.1  CL 93*  --  105 107 109  CO2 30  --  21* 21* 20*  GLUCOSE 123*  --  233* 233* 167*  BUN 60*  --  48* 51* 40*  CREATININE 0.87  --  0.76 0.78 0.61  CALCIUM 8.4*  --  7.6* 8.0* 8.0*   Liver Function Tests: Recent Labs  Lab 04/10/18 2207 04/12/18 0504 04/13/18 0446  AST 136* 377* 270*  ALT 126* 357* 320*  ALKPHOS 170* 137* 128*  BILITOT 0.4 0.5 0.7  PROT 5.0* 4.1* 4.0*  ALBUMIN 2.1* 2.2* 2.0*   No results for input(s): LIPASE, AMYLASE in the last 168 hours. No results for input(s): AMMONIA in the last 168 hours. CBC: Recent Labs  Lab 04/10/18 2207  04/11/18 0803 04/12/18 0119 04/12/18 0504 04/12/18 0624 04/13/18 0446  WBC 6.9   < > 6.1 5.4 8.0 8.3 4.3  NEUTROABS 5.6  --  4.7 4.6  --  6.9  --   HGB 6.8*   < > 7.5* 10.3* 6.4* 6.5* 9.2*  HCT 22.2*   < > 24.4* 34.4* 20.4* 21.0* 29.0*  MCV 93.3   < > 92.8 93.7 91.9 92.9 92.7  PLT 53*   < > 46* 32* 38* 40* 34*   < > = values in this interval not displayed.   Cardiac Enzymes: No results for input(s): CKTOTAL, CKMB, CKMBINDEX, TROPONINI in the last 168 hours. BNP: BNP (last 3 results) Recent Labs    04/10/18 2207  BNP 335.9*    ProBNP (last 3 results) No results for input(s): PROBNP in the last 8760 hours.  CBG: Recent Labs  Lab 04/12/18 2130 04/12/18 2215 04/13/18 0729 04/13/18 1227 04/13/18 1622  GLUCAP 66* 147* 175* 140* 100*        Signed:  Rhetta Mura MD   [facility] 04/14/2018, 12:16 PM

## 2018-04-14 NOTE — TOC Transition Note (Signed)
Transition of Care [facility]) - CM/SW Discharge Note   Patient Details  Name: [name] MRN: [id number] Date of Birth: 06-26-1932  Transition of Care [facility]) CM/SW Contact:  Althea Charon, LCSW Phone Number: 04/14/2018, 1:10 PM   Clinical Narrative:   Patient going to [facility] via ptar. RN to call [phone] for report    Final next level of care: Hospice Medical Facility Barriers to Discharge: No Barriers Identified   Patient Goals and CMS Choice Patient states their goals for this hospitalization and ongoing recovery are:: (get well)      Discharge Placement              [facility] Patient to be transferred to facility by: (ptar) Name of family member notified: daughter aware of dc Patient and family notified of of transfer: 04/14/18  Discharge Plan and Services   Discharge Planning Services: CM Consult Post Acute Care Choice: Skilled Nursing Facility                    Social Determinants of Health (SDOH) Interventions     Readmission Risk Interventions No flowsheet data found.

## 2018-04-14 NOTE — TOC Initial Note (Signed)
Transition of Care [facility](TOC) - Initial/Assessment Note    Patient Details  Name: [name] MRN: [id number] Date of Birth: 07/26/1932  Transition of Care [facility](TOC) CM/SW Contact:    Althea CharonAshley C Timm Bonenberger, LCSW Phone Number: 04/14/2018, 12:05 PM  Clinical Narrative:  Patients family agreeable with patient going to a hospice facility. Patient has bed at [facility]. CSW spoke with Olegario MessierKathy at facility and they stated they will reach out to family to do paperwork.                Expected Discharge Plan: Skilled Nursing Facility Barriers to Discharge: Continued Medical Work up   Patient Goals and CMS Choice Patient states their goals for this hospitalization and ongoing recovery are:: (get well)      Expected Discharge Plan and Services Expected Discharge Plan: Skilled Nursing Facility   Discharge Planning Services: CM Consult Post Acute Care Choice: Skilled Nursing Facility Living arrangements for the past 2 months: Skilled Nursing Facility                          Prior Living Arrangements/Services Living arrangements for the past 2 months: Skilled Nursing Facility Lives with:: Other (Comment)(SNF) Patient language and need for interpreter reviewed:: Yes Do you feel safe going back to the place where you live?: Yes      Need for Family Participation in Patient Care: No (Comment) Care giver support system in place?: Yes (comment)   Criminal Activity/Legal Involvement Pertinent to Current Situation/Hospitalization: No - Comment as needed  Activities of Daily Living Home Assistive Devices/Equipment: None ADL Screening (condition at time of admission) Patient's cognitive ability adequate to safely complete daily activities?: No Is the patient deaf or have difficulty hearing?: No Does the patient have difficulty seeing, even when wearing glasses/contacts?: No Does the patient have difficulty concentrating, remembering, or making decisions?: Yes Patient able to express  need for assistance with ADLs?: Yes Does the patient have difficulty dressing or bathing?: Yes Independently performs ADLs?: No Communication: Independent Dressing (OT): Needs assistance Grooming: Needs assistance Feeding: Needs assistance Bathing: Dependent Toileting: Dependent In/Out Bed: Dependent Does the patient have difficulty walking or climbing stairs?: Yes Weakness of Legs: Both Weakness of Arms/Hands: Both  Permission Sought/Granted Permission sought to share information with : Case Manager Permission granted to share information with : Yes, Verbal Permission Granted  Share Information with NAME: (S) [name]  Permission granted to share info w AGENCY: ([facility])  Permission granted to share info w Relationship: (dtr)  Permission granted to share info w Contact Information: [phone])  Emotional Assessment Appearance:: Appears stated age, Well-Groomed Attitude/Demeanor/Rapport: Gracious Affect (typically observed): Accepting Orientation: : Oriented to Self, Oriented to Place Alcohol / Substance Use: Never Used Psych Involvement: No (comment)  Admission diagnosis:  Symptomatic anemia [D64.9] Gastrointestinal hemorrhage, unspecified gastrointestinal hemorrhage type [K92.2] Patient Active Problem List   Diagnosis Date Noted  . Palliative care encounter   . Comfort measures only status   . DNR (do not resuscitate)   . Gastrointestinal hemorrhage   . Bradycardia, sinus   . Hypotension   . Lethargy   . Hypothermia   . Symptomatic anemia 04/10/2018  . Hyponatremia 04/10/2018  . Hyperkalemia 04/10/2018  . Chronic a-fib 04/10/2018  . HTN (hypertension) 04/10/2018  . Rectovaginal fistula 07/04/2013   PCP:  Gordan PaymentGrisso, Greg A., MD Pharmacy:   [facility] [place] - [address] [place] [address]  Phone: [phone] Fax: [phone]     Social Determinants of Health (SDOH) Interventions    Readmission Risk  Interventions No flowsheet data found.

## 2018-04-14 NOTE — Progress Notes (Signed)
Patient made comfort care.  Kerin Salen, MD

## 2018-04-14 NOTE — Progress Notes (Signed)
Daily Progress Note   Patient Name: [name]       Date: 04/14/2018 DOB: April 04, 1932  Age: 85 y.o. MRN#: [id number] Attending Physician: Rhetta Mura, MD Primary Care Physician: Gordan Payment., MD Admit Date: 04/10/2018  Reason for Consultation/Follow-up: Establishing goals of care and Psychosocial/spiritual support  Subjective: Patient seen together with Dr. Mahala Menghini.  Patient lethargic and confused.  Spoke with [name].  She is grieving.  [name] states that EMS rides are hard on her mother and requests that she be given medication just prior to riding in the ambulance.    Assessment: 85 yo female.  Appears lethargic and uncomfortable.  No active bleeding noted.   Patient Profile/HPI:  85 y.o. female  with past medical history of CAD, Afib, CHF (no echo in Epic), DM, AAA, temporal arteritis, rectovaginal fistula s/p surgical repair, who was admitted on 04/10/2018 from SNF with weakness, fatigue and a hgb of 7.3.  She was also noted to have hypoxia with an increased oxygen requirement.  [name] received a blood transfusion and she has been followed by Gastroenterology.  An endoscopy was planned but then cancelled as the patient had a event overnight (4/8-4/9) that included hypothermia (93.8 rectal), hypoglycemia, hypoxia, and bradycardia (dipping into the 20s and 30s).   Today (4/9) the patient is lethargic, with elevated LFTs, platlets of 34 and albumin of 2.0.  She is on 10L of oxygen.  Length of Stay: 4  Current Medications: Scheduled Meds:  . chlorhexidine  15 mL Mouth Rinse BID  . LORazepam  0.25 mg Intravenous BID  . mouth rinse  15 mL Mouth Rinse q12n4p  .  morphine injection  2 mg Intravenous Q4H    Continuous Infusions:   PRN Meds: acetaminophen, antiseptic  oral rinse, glycopyrrolate **OR** glycopyrrolate **OR** glycopyrrolate, haloperidol **OR** haloperidol **OR** haloperidol lactate, LORazepam, morphine injection, ondansetron **OR** ondansetron (ZOFRAN) IV, polyvinyl alcohol  Physical Exam        Elderly female, sleeping, occ moans and shifts from side to side.  Old blood noted on tongue.  CV rrr resp coarse breath sounds Abdomen soft, tender to palpation  Vital Signs: BP (!) 101/52 (BP Location: Left Wrist)   Pulse (!) 59   Temp 97.9 F (36.6 C) (Oral)   Resp 12   Ht 5\' 1"  (1.549 m)  Wt 47.2 kg   SpO2 94%   BMI 19.65 kg/m  SpO2: SpO2: 94 % O2 Device: O2 Device: Nasal Cannula O2 Flow Rate: O2 Flow Rate (L/min): 1 L/min  Intake/output summary:   Intake/Output Summary (Last 24 hours) at 04/14/2018 1012 Last data filed at 04/14/2018 0600 Gross per 24 hour  Intake 983.16 ml  Output 320 ml  Net 663.16 ml   LBM: Last BM Date: 04/13/18 Baseline Weight: Weight: 47.2 kg Most recent weight: Weight: 47.2 kg       Palliative Assessment/Data:10    Flowsheet Rows     Most Recent Value  Intake Tab  Referral Department  Hospitalist  Unit at Time of Referral  Med/Surg Unit  Palliative Care Primary Diagnosis  Pulmonary  Date Notified  04/13/18  Palliative Care Type  New Palliative care  Reason for referral  Clarify Goals of Care  Date of Admission  04/10/18  Date first seen by Palliative Care  04/13/18  # of days Palliative referral response time  0 Day(s)  # of days IP prior to Palliative referral  3  Clinical Assessment  Psychosocial & Spiritual Assessment  Palliative Care Outcomes      Patient Active Problem List   Diagnosis Date Noted  . Comfort measures only status   . DNR (do not resuscitate)   . Gastrointestinal hemorrhage   . Bradycardia, sinus   . Hypotension   . Lethargy   . Hypothermia   . Symptomatic anemia 04/10/2018  . Hyponatremia 04/10/2018  . Hyperkalemia 04/10/2018  . Chronic a-fib 04/10/2018  .  HTN (hypertension) 04/10/2018  . Rectovaginal fistula 07/04/2013    Palliative Care Plan    Recommendations/Plan:  [facility]  Scheduled q 4 morphine for continuous pain control - in addition to PRN morphine  Very low dose scheduled ativan  Appreciate excellent nursing care.  Goals of Care and Additional Recommendations:  Limitations on Scope of Treatment: Full Comfort Care  Code Status:  DNR  Prognosis:   Hours - Days   Discharge Planning:  Hospice facility  Care plan was discussed with Dr. Mahala MenghiniSamtani, CSW, bedside RN.  Called and spoke to [name].  [name] is naturally sad but encouraged that her mother may be transferred closer to home.    Thank you for allowing the Palliative Medicine Team to assist in the care of this patient.  Total time spent:  25 min     Greater than 50%  of this time was spent counseling and coordinating care related to the above assessment and plan.  Caitlin RichardsMarianne Tsuyako Jolley, PA-C Palliative Medicine  Please contact Palliative MedicineTeam phone at [phone] for questions and concerns between 7 am - 7 pm.   Please see AMION for individual provider pager numbers.

## 2018-04-14 NOTE — Progress Notes (Signed)
Report called to [facility] at [facility], request that both IVs be left in. Awaiting [facility] to transport.

## 2018-05-05 DEATH — deceased

## 2019-12-15 IMAGING — CT CT HEAD WITHOUT CONTRAST
3 series · 16 of 47 positions shown, 19 images · non-contrast
Comparison: Head CT 11/09/2017

CLINICAL DATA: Altered level of consciousness. Weakness and
lethargy.

EXAM:
CT HEAD WITHOUT CONTRAST
TECHNIQUE: Contiguous axial images were obtained from the base of the skull
through the vertex without intravenous contrast.

[Series 2: head wo · axial · 0.43mm/px · z∈[-122,+8]mm · 10 of 32 slices shown, 13 images]
[im 3/32  brain]
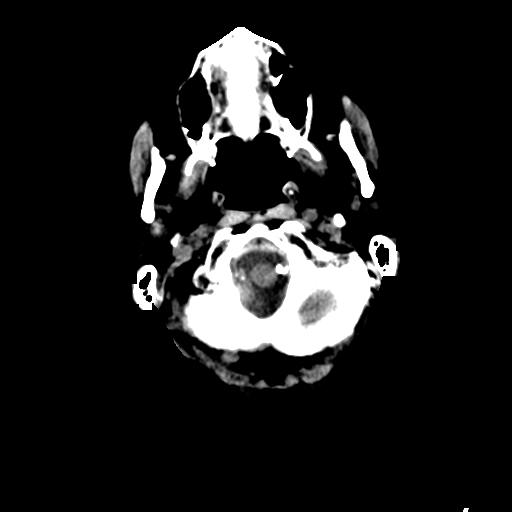
[im 3/32  bone]
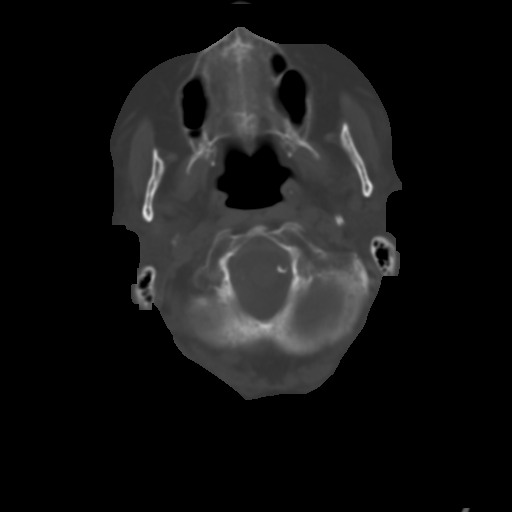
[im 6/32  brain]
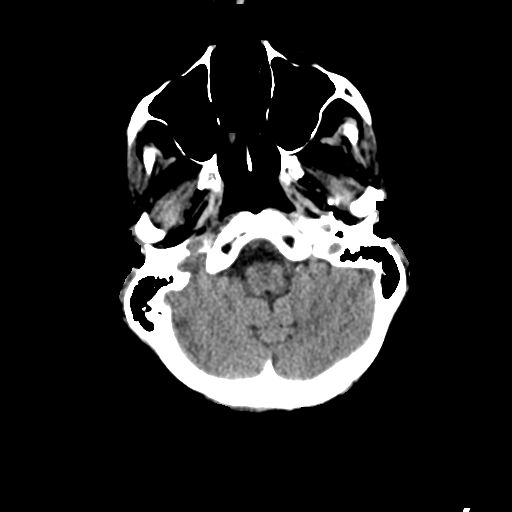
[im 9/32  brain]
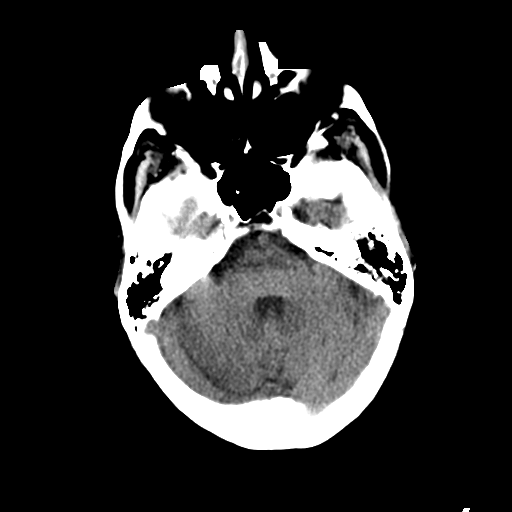
[im 11/32  brain]
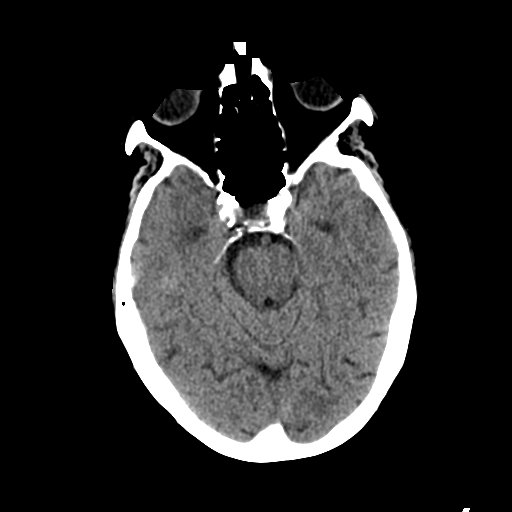
[im 14/32  brain]
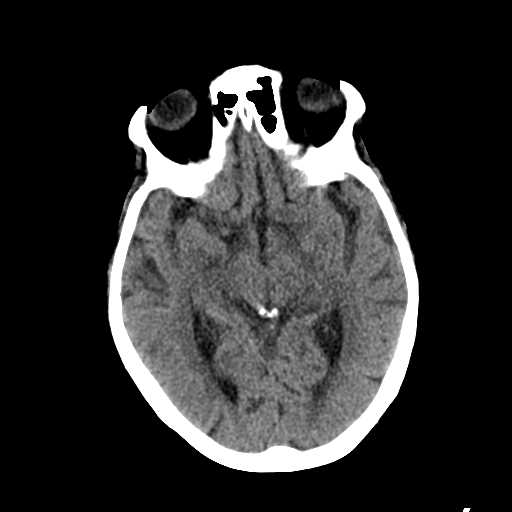
[im 14/32  bone]
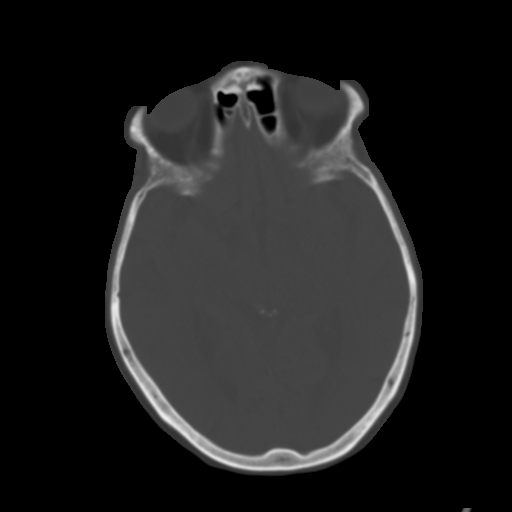
[im 18/32  brain]
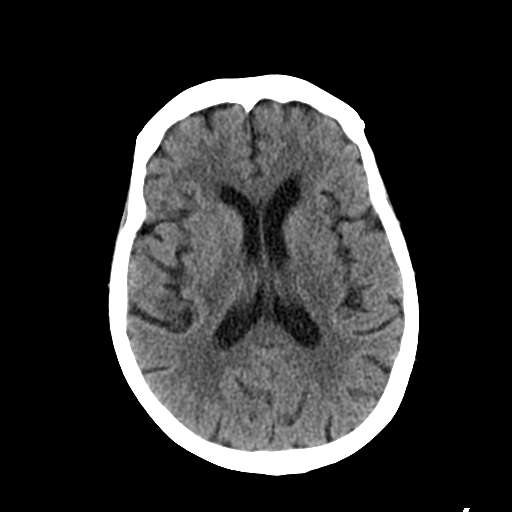
[im 21/32  brain]
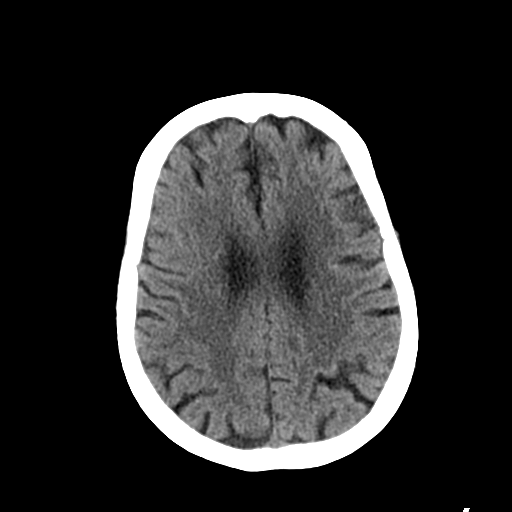
[im 24/32  brain]
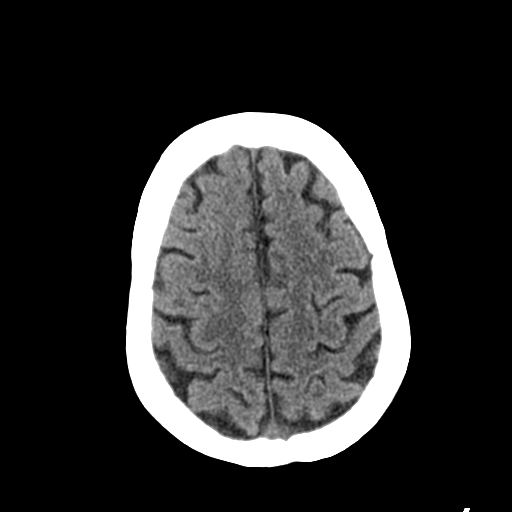
[im 26/32  brain]
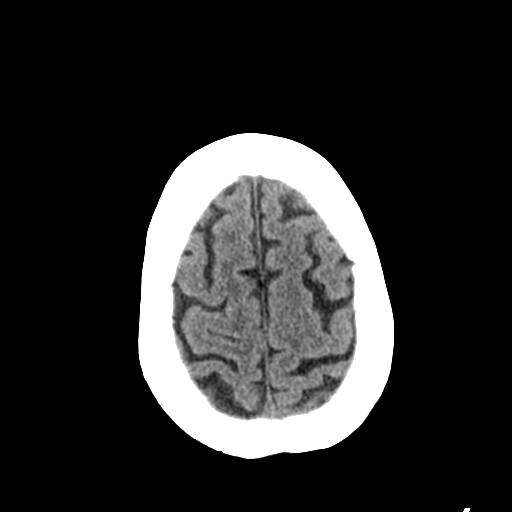
[im 26/32  bone]
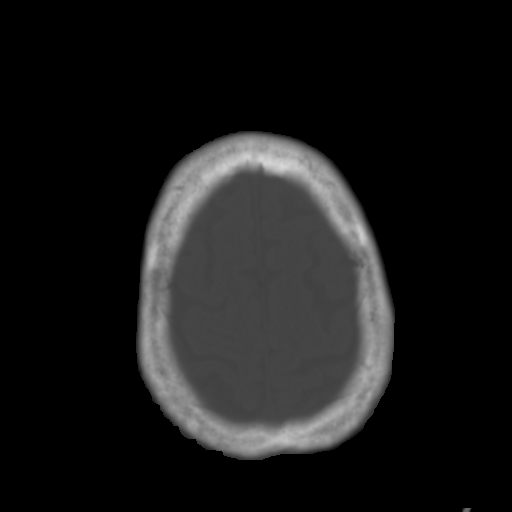
[im 29/32  brain]
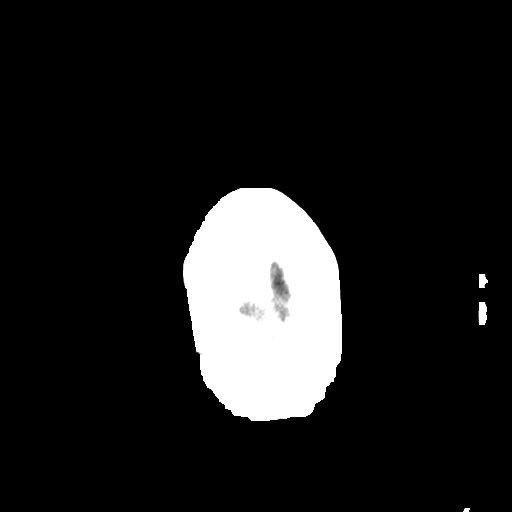

[Series 5: coronal soft tissue · coronal · 0.31mm/px · 3 of 67 slices shown]
[im 23/67  brain]
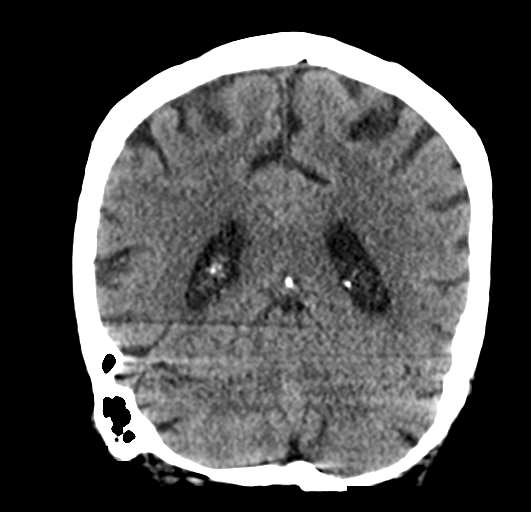
[im 30/67  brain]
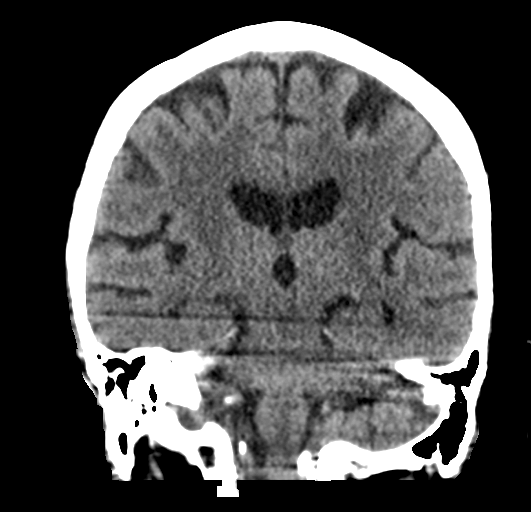
[im 37/67  brain]
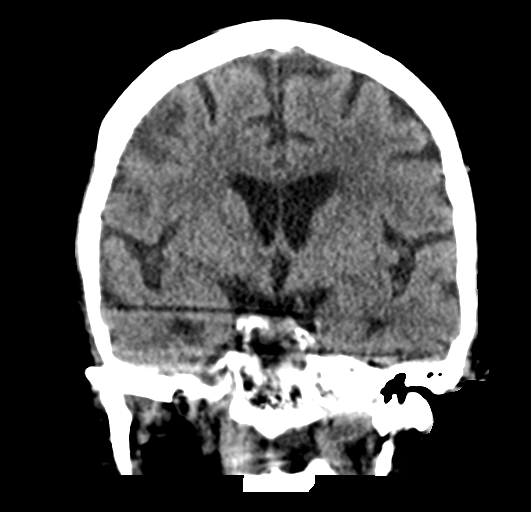

[Series 6: sagittal soft tissue · sagittal · 0.31mm/px · 3 of 52 slices shown]
[im 18/52  brain]
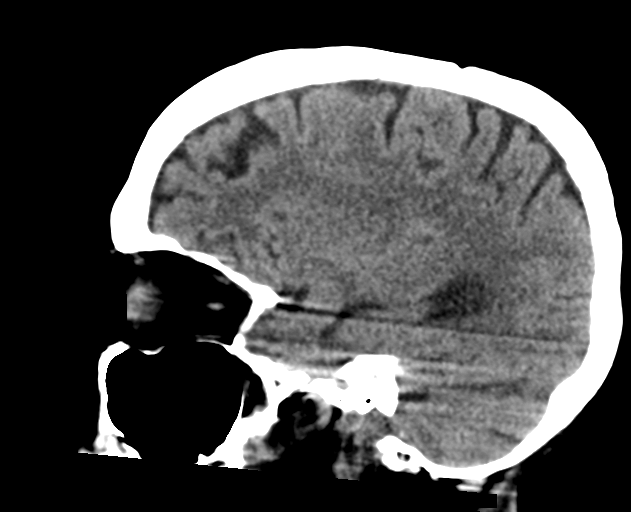
[im 26/52  brain]
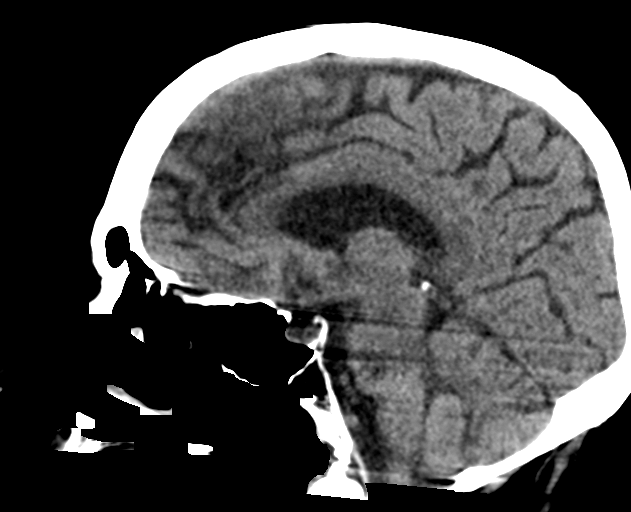
[im 35/52  brain]
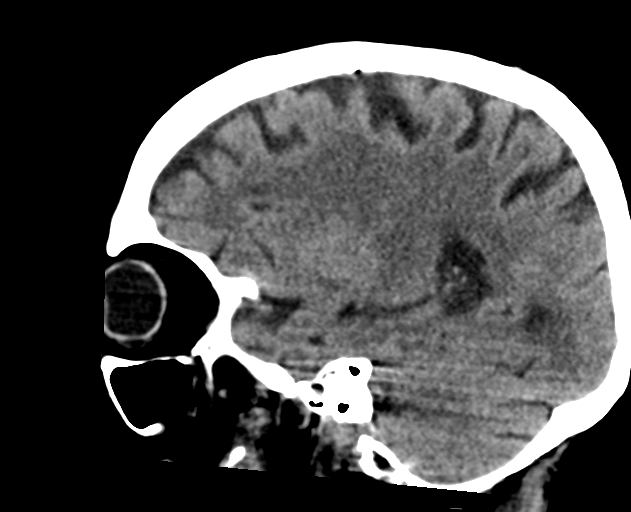

[16 of 47 positions shown; findings below may reference images not displayed]

FINDINGS: Brain: No intracranial hemorrhage, mass effect, or midline shift.
Brain volume is normal for age. No hydrocephalus. The basilar
cisterns are patent. No evidence of territorial infarct or acute
ischemia. No extra-axial or intracranial fluid collection.

Vascular: Atherosclerosis of skullbase vasculature without
hyperdense vessel or abnormal calcification.

Skull: No fracture or focal lesion.

Sinuses/Orbits: No acute findings. Bilateral cataract resection.

Other: Multiple small metallic densities in the left temporal scalp
in just lateral to the superior left orbit are new from prior exam,
no associated air.
IMPRESSION: 1. No acute intracranial abnormality.
2. Punctate metallic densities in the left frontal scalp soft tissue
are new from November 2017 but do not appear acute.

## 2019-12-17 IMAGING — DX PORTABLE CHEST - 1 VIEW
1 series · 1 of 1 positions shown · non-contrast
Comparison: 04/10/2018

CLINICAL DATA: Pneumonia

EXAM:
PORTABLE CHEST 1 VIEW

[chest ap]
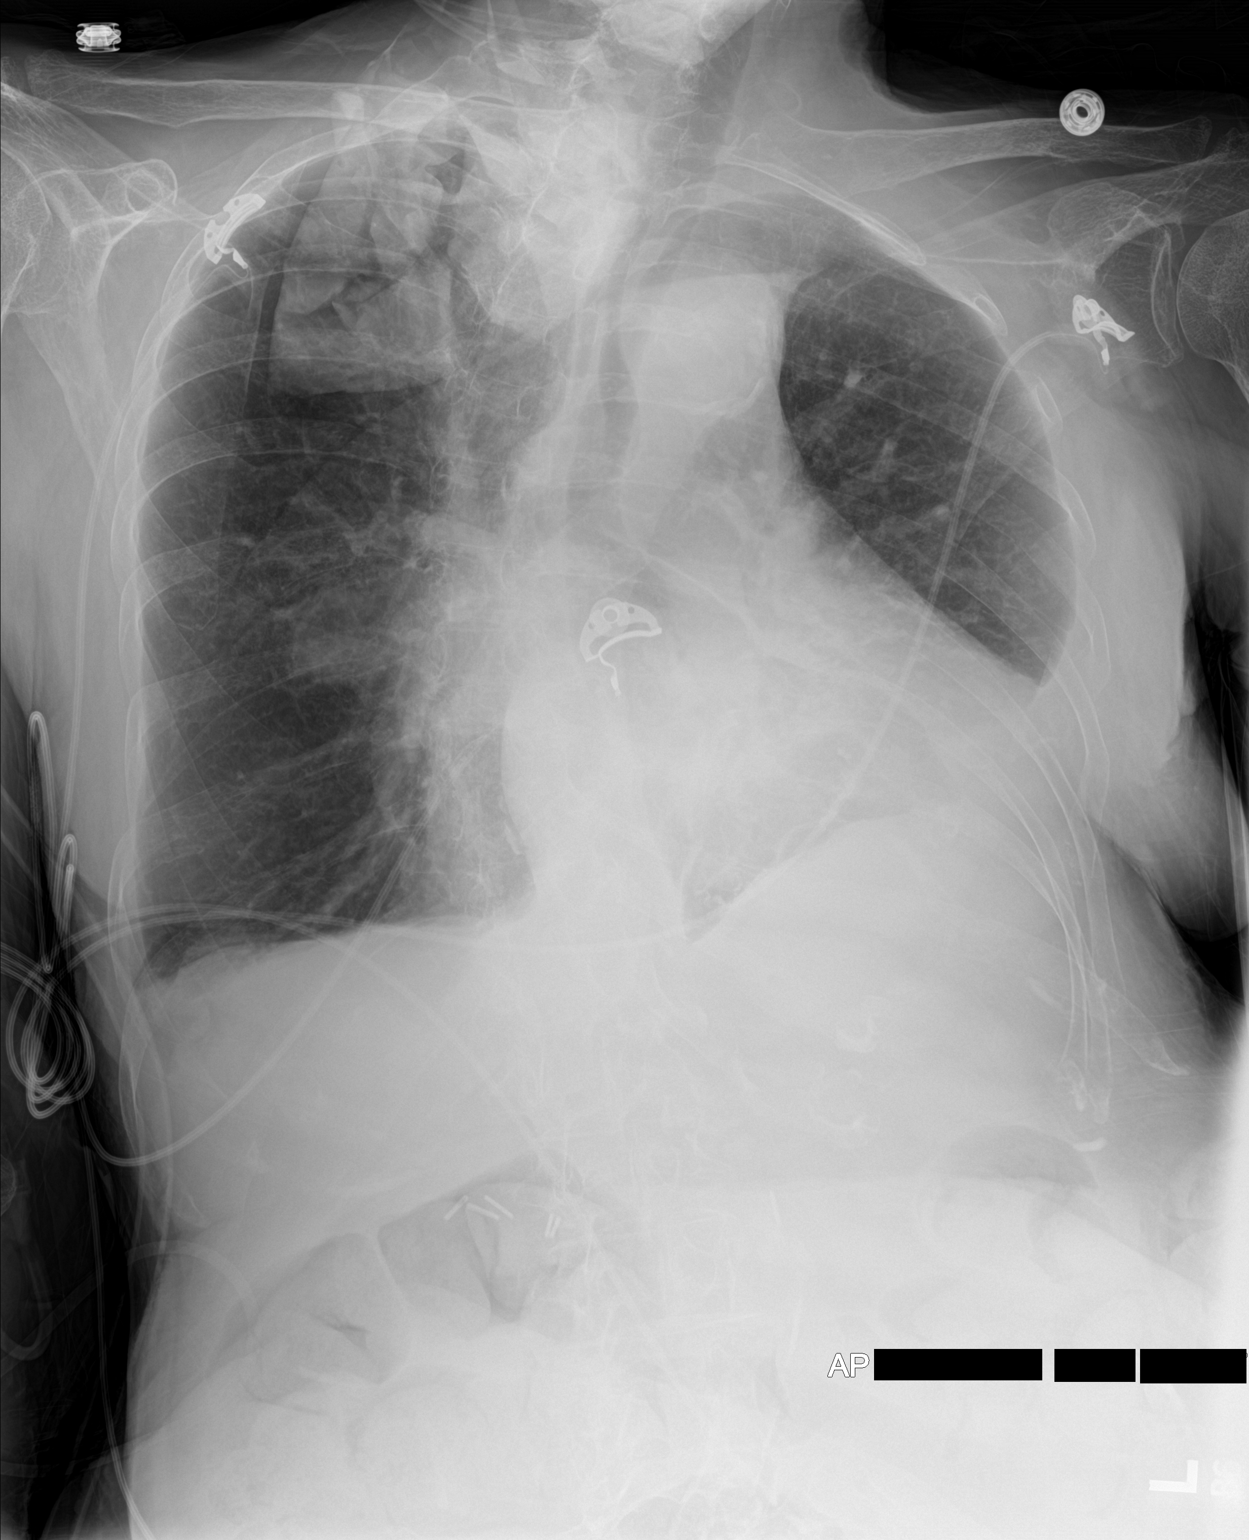

[1 of 1 positions shown; findings below may reference images not displayed]

FINDINGS: Cardiomegaly. Small left pleural effusion. Left lower lobe opacity,
likely atelectasis. Mild vascular congestion. Soft tissue density
projects over the right upper lobe which appears to be external to
the patient. Right hilar prominence is stable, likely related to
pulmonary artery. Mild hyperinflation.
IMPRESSION: Cardiomegaly. Prominent right hilum, likely related to enlarged
pulmonary artery and possible pulmonary arterial hypertension.

Soft tissue density projects over the right upper lobe which appears
to be external to the patient.

Small left effusion.  Left lower lobe atelectasis.
